# Patient Record
Sex: Male | Born: 1950 | Race: White | Hispanic: No | Marital: Married | Smoking: Former smoker
Health system: Southern US, Community
[De-identification: ages and names within clinical notes are randomized; demographics above are authoritative.]

## PROBLEM LIST (undated history)

## (undated) ENCOUNTER — Emergency Department (HOSPITAL_BASED_OUTPATIENT_CLINIC_OR_DEPARTMENT_OTHER): Admission: EM | Payer: PRIVATE HEALTH INSURANCE

## (undated) DIAGNOSIS — R079 Chest pain, unspecified: Secondary | ICD-10-CM

## (undated) DIAGNOSIS — G4730 Sleep apnea, unspecified: Secondary | ICD-10-CM

## (undated) DIAGNOSIS — R0789 Other chest pain: Secondary | ICD-10-CM

## (undated) DIAGNOSIS — I10 Essential (primary) hypertension: Secondary | ICD-10-CM

## (undated) DIAGNOSIS — E7800 Pure hypercholesterolemia, unspecified: Secondary | ICD-10-CM

## (undated) DIAGNOSIS — R7303 Prediabetes: Secondary | ICD-10-CM

## (undated) DIAGNOSIS — L570 Actinic keratosis: Secondary | ICD-10-CM

## (undated) HISTORY — DX: Prediabetes: R73.03

## (undated) HISTORY — PX: VASECTOMY: SHX75

## (undated) HISTORY — PX: COLONOSCOPY W/ BIOPSIES AND POLYPECTOMY: SHX1376

## (undated) HISTORY — PX: WISDOM TOOTH EXTRACTION: SHX21

## (undated) HISTORY — DX: Chest pain, unspecified: R07.9

## (undated) HISTORY — DX: Actinic keratosis: L57.0

## (undated) HISTORY — DX: Sleep apnea, unspecified: G47.30

## (undated) HISTORY — DX: Essential (primary) hypertension: I10

## (undated) HISTORY — DX: Other chest pain: R07.89

---

## 2001-12-31 ENCOUNTER — Ambulatory Visit (HOSPITAL_BASED_OUTPATIENT_CLINIC_OR_DEPARTMENT_OTHER): Admission: RE | Admit: 2001-12-31 | Discharge: 2001-12-31 | Payer: Self-pay | Admitting: Family Medicine

## 2004-03-25 ENCOUNTER — Ambulatory Visit (HOSPITAL_COMMUNITY): Admission: RE | Admit: 2004-03-25 | Discharge: 2004-03-25 | Payer: Self-pay | Admitting: Gastroenterology

## 2004-03-25 ENCOUNTER — Encounter (INDEPENDENT_AMBULATORY_CARE_PROVIDER_SITE_OTHER): Payer: Self-pay | Admitting: *Deleted

## 2004-10-12 ENCOUNTER — Encounter: Admission: RE | Admit: 2004-10-12 | Discharge: 2004-10-12 | Payer: Self-pay | Admitting: Family Medicine

## 2009-10-15 ENCOUNTER — Ambulatory Visit: Payer: Self-pay | Admitting: Pulmonary Disease

## 2009-10-15 DIAGNOSIS — E785 Hyperlipidemia, unspecified: Secondary | ICD-10-CM

## 2009-10-15 DIAGNOSIS — G4733 Obstructive sleep apnea (adult) (pediatric): Secondary | ICD-10-CM

## 2009-10-15 DIAGNOSIS — R05 Cough: Secondary | ICD-10-CM

## 2011-01-08 ENCOUNTER — Encounter: Payer: Self-pay | Admitting: Family Medicine

## 2011-05-06 NOTE — Op Note (Signed)
NAME:  LOUDON, KRAKOW NO.:  [id number]   MEDICAL RECORD NO.:  [id number]                   PATIENT TYPE:  AMB   LOCATION:  ENDO                                 FACILITY:  [facility]   PHYSICIAN:  Graylin Shiver, M.D.                DATE OF BIRTH:  Mar 31, 1951   DATE OF PROCEDURE:  03/25/2004  DATE OF DISCHARGE:                                 OPERATIVE REPORT   PROCEDURE:  Colonoscopy with polypectomy and biopsy.   INDICATIONS FOR PROCEDURE:  Family history of colon polyps and colon cancer.   CONSENT:  Informed consent was obtained after explanation of the risks of  bleeding, infection, and perforation.   PREMEDICATION:  Fentanyl 80 mcg IV, Versed 8 mg IV.   PROCEDURE IN DETAIL:  With the patient in the left lateral decubitus  position, a rectal exam was performed and no masses were felt.  The Olympus  colonoscope was inserted into the rectum and advanced around the colon to  the cecum.  Cecal landmarks were identified.  The cecum and ascending colon  were normal.  In the mid transverse colon, there was a small polyp which was  biopsied with cold forceps.  The descending colon and sigmoid revealed  diverticulosis.  In the descending colon, there was a small polyp biopsied  with cold forceps.  In the sigmoid, there were several small polyps which  were snared and removed by snare cautery technique.  In the rectum, there  were several small polyps, one of which was snared, the others were biopsied  off with cold forceps.  The polypoid tissue was retrieved.  He tolerated the  procedure well without complications.   IMPRESSION:  1. Numerous colon polyps.  2. Diverticulosis.   PLAN:  The pathology will be checked.  I would recommend follow up  colonoscopy again in three years.                                               Graylin Shiver, M.D.    SFG/MEDQ  D:  03/25/2004  T:  03/25/2004  Job:  [id number]   cc:   Caryn Bee L. Little, M.D.  [address]  [place]  [place] [address]  Fax: [phone]

## 2013-07-14 ENCOUNTER — Encounter (HOSPITAL_BASED_OUTPATIENT_CLINIC_OR_DEPARTMENT_OTHER): Payer: Self-pay | Admitting: *Deleted

## 2013-07-14 ENCOUNTER — Emergency Department (HOSPITAL_BASED_OUTPATIENT_CLINIC_OR_DEPARTMENT_OTHER)
Admission: EM | Admit: 2013-07-14 | Discharge: 2013-07-14 | Disposition: A | Discharge status: Home / Self Care | Payer: BC Managed Care – PPO | Attending: Emergency Medicine | Admitting: Emergency Medicine

## 2013-07-14 DIAGNOSIS — Y9389 Activity, other specified: Secondary | ICD-10-CM | POA: Insufficient documentation

## 2013-07-14 DIAGNOSIS — Z7982 Long term (current) use of aspirin: Secondary | ICD-10-CM | POA: Insufficient documentation

## 2013-07-14 DIAGNOSIS — Y92009 Unspecified place in unspecified non-institutional (private) residence as the place of occurrence of the external cause: Secondary | ICD-10-CM | POA: Insufficient documentation

## 2013-07-14 DIAGNOSIS — Z8639 Personal history of other endocrine, nutritional and metabolic disease: Secondary | ICD-10-CM | POA: Insufficient documentation

## 2013-07-14 DIAGNOSIS — S058X9A Other injuries of unspecified eye and orbit, initial encounter: Secondary | ICD-10-CM | POA: Insufficient documentation

## 2013-07-14 DIAGNOSIS — IMO0002 Reserved for concepts with insufficient information to code with codable children: Secondary | ICD-10-CM | POA: Insufficient documentation

## 2013-07-14 DIAGNOSIS — S0502XA Injury of conjunctiva and corneal abrasion without foreign body, left eye, initial encounter: Secondary | ICD-10-CM

## 2013-07-14 DIAGNOSIS — Z862 Personal history of diseases of the blood and blood-forming organs and certain disorders involving the immune mechanism: Secondary | ICD-10-CM | POA: Insufficient documentation

## 2013-07-14 HISTORY — DX: Pure hypercholesterolemia, unspecified: E78.00

## 2013-07-14 MED ORDER — MOXIFLOXACIN HCL 0.5 % OP SOLN: 1.0000 [drp] | Freq: Three times a day (TID) | OPHTHALMIC | Status: DC

## 2013-07-14 MED ORDER — TETRACAINE HCL 0.5 % OP SOLN
OPHTHALMIC | Status: AC
  Administered 2013-07-14: 21:00:00
  Filled 2013-07-14: qty 2

## 2013-07-14 MED ORDER — FLUORESCEIN SODIUM 1 MG OP STRP
ORAL_STRIP | OPHTHALMIC | Status: AC
  Administered 2013-07-14: 21:00:00
  Filled 2013-07-14: qty 1

## 2013-07-14 NOTE — ED Notes (Signed)
  Pt reports mowing grass and getting stuck in the eye by a tree branch.  (L) eye red, drainage noted.  Denies change in vision.

## 2013-07-14 NOTE — ED Provider Notes (Signed)
CSN: [id number]     Arrival date & time 07/14/13  1925 History  This chart was scribed for Rolan Bucco, MD by Bennett Scrape, ED Scribe. This patient was seen in room MH05/MH05 and the patient's care was started at 8:22 PM.   First MD Initiated Contact with Patient 07/14/13 2007     Chief Complaint  Patient presents with  . Eye Injury    Patient is a 62 y.o. male presenting with eye injury. The history is provided by the patient. No language interpreter was used.  Eye Injury This is a new problem. The current episode started 6 to 12 hours ago. The problem occurs constantly. The problem has not changed since onset.Exacerbated by: opening eye. Relieved by: keeping eye closed.    HPI Comments: Ronald Peters is a 62 y.o. male who presents to the Emergency Department complaining of left eye injury that occurred around 2:30 PM today. Pt was mowing the yard and had a "tiny" twig fly up and hit him directly in the eye. He admits that he was wearing lasses at the time but states that the twig flew under the glasses and broke upon impact with his eye. He reports pain with opening his eye and lists watery drainage as an associated symptom. He states that his is concerned over having the feeling of a foreign body in his eye. He denies changes in his vision. Last TD was in 2007.   Past Medical History  Diagnosis Date  . Hypercholesteremia    History reviewed. No pertinent past surgical history. History reviewed. No pertinent family history. History  Substance Use Topics  . Smoking status: Never Smoker   . Smokeless tobacco: Not on file  . Alcohol Use: Yes     Comment: socially    Review of Systems  Eyes: Positive for pain, discharge and redness. Negative for visual disturbance.  Skin: Negative for wound.    Allergies  Review of patient's allergies indicates no known allergies.  Home Medications   Current Outpatient Rx  Name  Route  Sig  Dispense  Refill  . aspirin 81 MG tablet  Oral   Take 81 mg by mouth daily.         Marland Kitchen moxifloxacin (VIGAMOX) 0.5 % ophthalmic solution   Left Eye   Place 1 drop into the left eye 3 (three) times daily.   3 mL   0    Triage Vitals: BP 177/88  Pulse 60  Temp(Src) 98.6 F (37 C) (Oral)  Resp 18  Ht 5\' 11"  (1.803 m)  Wt 280 lb (127.007 kg)  BMI 39.07 kg/m2  SpO2 98%  Physical Exam  Nursing note and vitals reviewed. Constitutional: He is oriented to person, place, and time. He appears well-developed and well-nourished. No distress.  HENT:  Head: Normocephalic and atraumatic.  Eyes: EOM are normal. Pupils are equal, round, and reactive to light.  Left eye is erythematous, erythema in conjunctiva with watery discharge, no foreign body, no hyphema, fluorescein uptake at the 2 o'clock position of the cornea   Neck: Normal range of motion. Neck supple.  Musculoskeletal: Normal range of motion.  Neurological: He is alert and oriented to person, place, and time.  Skin: Skin is warm and dry.  Psychiatric: He has a normal mood and affect. His behavior is normal.    ED Course   Procedures (including critical care time)  DIAGNOSTIC STUDIES: Oxygen Saturation is 98% on room air, normal by my interpretation.    COORDINATION OF  CARE: 8:24 PM-Discussed treatment plan which includes tetracaine drops for further eye exam with pt at bedside and pt agreed to plan.  8:25 PM-Administered 2 drops of tetracaine to the left eye and fluorescein to the left eye. 8:28 PM-Informed pt of corneal abrasion. Discussed discharge plan which includes antibiotic eye drops with pt and pt agreed to plan. Pt declined pain medications. Also advised pt to follow up with an optometrist and pt agreed. BP is 154/93. Advised pt to watch his BP at home and f/u with PCP as needed. Addressed symptoms to return for with pt.   1. Corneal abrasion, left, initial encounter     MDM  Patient started on antibiotics. He has a regular optometrist he sees a [facility]. He was advised to followup with them if his symptoms do not improve within the next 48 hours or return here as needed for any worsening symptoms. He denied the need for any further pain medication. His tetanus shot is up-to-date.  I personally performed the services described in this documentation, which was scribed in my presence.  The recorded information has been reviewed and considered.    Rolan Bucco, MD 07/14/13 [phone]

## 2013-09-25 ENCOUNTER — Encounter (INDEPENDENT_AMBULATORY_CARE_PROVIDER_SITE_OTHER): Payer: Self-pay

## 2013-09-25 ENCOUNTER — Encounter: Payer: Self-pay | Admitting: Pulmonary Disease

## 2013-09-25 ENCOUNTER — Ambulatory Visit (INDEPENDENT_AMBULATORY_CARE_PROVIDER_SITE_OTHER): Payer: BC Managed Care – PPO | Admitting: Pulmonary Disease

## 2013-09-25 VITALS — BP 138/92 | HR 62 | Temp 98.0°F | Ht 71.5 in | Wt 289.2 lb

## 2013-09-25 DIAGNOSIS — G4733 Obstructive sleep apnea (adult) (pediatric): Secondary | ICD-10-CM

## 2013-09-25 NOTE — Assessment & Plan Note (Signed)
The patient has a history of significant obstructive sleep apnea, and has been wearing CPAP compliantly.  Unfortunately he has not followed up in many many years, but has kept up with his mask cushion changes.  He is now due for a mass, filters, hoses, and also a new device.  I will send the order into his home care company, and I've also encouraged him to work aggressively on weight loss.  I also reminded him that I typically see my sleep apnea patient yearly.

## 2013-09-25 NOTE — Patient Instructions (Signed)
Will send in an order for a new cpap device. Keep up with mask changes and supplies. Work on weight loss followup with me in one year if doing well.

## 2013-09-25 NOTE — Progress Notes (Signed)
Subjective:    Patient ID: Ronald Peters, male    DOB: [birth year], 62 y.o.   MRN: [id number]  HPI The patient is a 62 year old male who comes in today for reevaluation of obstructive sleep apnea.  He was diagnosed many years ago on CPAP, and has been compliant with his device over the years.  He has been keeping up with his cushion changes on his mask, but now the actual mask structure has broken, and he needs a replacement.  The patient feels that he has slept well with his device, and denies any pressure or issues.  He tells me that his machine is 60 years old, and he is due for a replacement.  He denies any significant sleepiness during the day, and his Epworth score is only 9.  He feels that he awakens refreshed in the mornings.  He tells me that his weight is up about 10 pounds over the last 2 years.   Sleep Questionnaire What time do you typically go to bed?( Between what hours) 10-1129 10-1129 at 1134 on 09/25/13 by Maisie Fus, CMA How long does it take you to fall asleep?  at 1134 on 09/25/13 by Maisie Fus, CMA How many times during the night do you wake up? 0 0 at 1134 on 09/25/13 by Maisie Fus, CMA What time do you get out of bed to start your day? 0530 0530 at 1134 on 09/25/13 by Maisie Fus, CMA Do you drive or operate heavy machinery in your occupation? No No at 1134 on 09/25/13 by Maisie Fus, CMA How much has your weight changed (up or down) over the past two years? (In pounds) 10 lb (4.536 kg)10 lb (4.536 kg) increase at 1134 on 09/25/13 by Maisie Fus, CMA Have you ever had a sleep study before? Yes Yes at 1134 on 09/25/13 by Maisie Fus, CMA If yes, [facility] at 1134 on 09/25/13 by Maisie Fus, CMA If yes, date of study? 10+years ago 10+years ago at 1134 on 09/25/13 by Maisie Fus, CMA Do you currently use CPAP? Yes Yes at 1134 on 09/25/13 by Maisie Fus, CMA If so, what pressure? 12 12 at 1134 on  09/25/13 by Maisie Fus, CMA Do you wear oxygen at any time? No No at 1134 on 09/25/13 by Maisie Fus, CMA   Review of Systems  Constitutional: Positive for unexpected weight change ( increase). Negative for fever.  HENT: Negative for congestion, dental problem, ear pain, nosebleeds, postnasal drip, rhinorrhea, sinus pressure, sneezing, sore throat and trouble swallowing.   Eyes: Negative for redness and itching.  Respiratory: Positive for shortness of breath. Negative for cough, chest tightness and wheezing.   Cardiovascular: Negative for palpitations and leg swelling.  Gastrointestinal: Negative for nausea and vomiting.  Genitourinary: Negative for dysuria.  Musculoskeletal: Negative for joint swelling.  Skin: Negative for rash.  Neurological: Negative for headaches.  Hematological: Does not bruise/bleed easily.  Psychiatric/Behavioral: Negative for dysphoric mood. The patient is not nervous/anxious.        Objective:   Physical Exam Constitutional:  Overweight male, no acute distress  HENT:  Nares patent without discharge, mild septal deviation to the left with narrowing.  Oropharynx without exudate, palate and uvula are thickened and elongated.   Eyes:  Perrla, eomi, no scleral icterus  Neck:  No JVD, no TMG  Cardiovascular:  Normal rate, regular rhythm, no rubs or gallops.  No murmurs  Intact distal pulses  Pulmonary :  Normal breath sounds, no stridor or respiratory distress   No rales, rhonchi, or wheezing  Abdominal:  Soft, nondistended, bowel sounds present.  No tenderness noted.   Musculoskeletal:  mild lower extremity edema noted.  Lymph Nodes:  No cervical lymphadenopathy noted  Skin:  No cyanosis noted  Neurologic:  Alert, appropriate, moves all 4 extremities without obvious deficit.         Assessment & Plan:

## 2017-03-09 ENCOUNTER — Encounter (HOSPITAL_COMMUNITY): Payer: Self-pay | Admitting: Emergency Medicine

## 2017-03-09 ENCOUNTER — Emergency Department (HOSPITAL_COMMUNITY): Payer: No Typology Code available for payment source

## 2017-03-09 ENCOUNTER — Emergency Department (HOSPITAL_COMMUNITY)
Admission: EM | Admit: 2017-03-09 | Discharge: 2017-03-09 | Disposition: A | Discharge status: Home / Self Care | Payer: No Typology Code available for payment source | Attending: Emergency Medicine | Admitting: Emergency Medicine

## 2017-03-09 DIAGNOSIS — Z7982 Long term (current) use of aspirin: Secondary | ICD-10-CM | POA: Insufficient documentation

## 2017-03-09 DIAGNOSIS — R0789 Other chest pain: Secondary | ICD-10-CM | POA: Diagnosis present

## 2017-03-09 DIAGNOSIS — I10 Essential (primary) hypertension: Secondary | ICD-10-CM | POA: Insufficient documentation

## 2017-03-09 DIAGNOSIS — R079 Chest pain, unspecified: Secondary | ICD-10-CM

## 2017-03-09 DIAGNOSIS — Z87891 Personal history of nicotine dependence: Secondary | ICD-10-CM | POA: Insufficient documentation

## 2017-03-09 DIAGNOSIS — Z79899 Other long term (current) drug therapy: Secondary | ICD-10-CM | POA: Insufficient documentation

## 2017-03-09 HISTORY — DX: Essential (primary) hypertension: I10

## 2017-03-09 LAB — BASIC METABOLIC PANEL
Anion gap: 9 (ref 5–15)
BUN: 10 mg/dL (ref 6–20)
CALCIUM: 9.7 mg/dL (ref 8.9–10.3)
CHLORIDE: 102 mmol/L (ref 101–111)
CO2: 28 mmol/L (ref 22–32)
CREATININE: 1 mg/dL (ref 0.61–1.24)
GFR calc Af Amer: >60 mL/min (ref >60)
GFR calc non Af Amer: >60 mL/min (ref >60)
GLUCOSE: 166 mg/dL — AB (ref 65–99)
Potassium: 3.7 mmol/L (ref 3.5–5.1)
Sodium: 139 mmol/L (ref 135–145)

## 2017-03-09 LAB — I-STAT TROPONIN, ED
TROPONIN I, POC: 0 ng/mL (ref 0.00–0.08)
Troponin i, poc: 0 ng/mL (ref 0.00–0.08)

## 2017-03-09 LAB — CBC
HCT: 47.2 % (ref 39.0–52.0)
Hemoglobin: 15.5 g/dL (ref 13.0–17.0)
MCH: 31.2 pg (ref 26.0–34.0)
MCHC: 32.8 g/dL (ref 30.0–36.0)
MCV: 95 fL (ref 78.0–100.0)
PLATELETS: 168 10*3/uL (ref 150–400)
RBC: 4.97 MIL/uL (ref 4.22–5.81)
RDW: 13.7 % (ref 11.5–15.5)
WBC: 7.2 10*3/uL (ref 4.0–10.5)

## 2017-03-09 NOTE — ED Triage Notes (Signed)
Patient reports mid/epigastric chest pain " pressure" onset this morning , denies SOB , no nausea or diaphoresis .

## 2017-03-09 NOTE — ED Provider Notes (Signed)
[facility] DEPT Provider Note   CSN: [id number] Arrival date & time: 03/09/17  0026     History   Chief Complaint Chief Complaint  Patient presents with  . Chest Pain    HPI Ronald Peters is a 66 y.o. male.  Patient with HLD, OSA presents for evaluation of chest tightness that started yesterday morning while at home. The tightness was constant and progressive throughout the day. No associated SOB, nausea, cough. No recent illness or fever. No modifying factors. No belching, sensation of gas or abdominal distention. Since arrival to the hospital his symptoms have completely resolved.   The history is provided by the patient. No language interpreter was used.  Chest Pain   Pertinent negatives include no abdominal pain, no cough, no fever, no nausea, no shortness of breath and no vomiting.    Past Medical History:  Diagnosis Date  . Hypercholesteremia   . Hypertension   . Pre-diabetes   . Sleep apnea     Patient Active Problem List   Diagnosis Date Noted  . HYPERLIPIDEMIA 10/15/2009  . OBSTRUCTIVE SLEEP APNEA 10/15/2009  . COUGH 10/15/2009    Past Surgical History:  Procedure Laterality Date  . COLONOSCOPY W/ BIOPSIES AND POLYPECTOMY    . WISDOM TOOTH EXTRACTION         Home Medications    Prior to Admission medications   Medication Sig Start Date End Date Taking? Authorizing Provider  aspirin 81 MG tablet Take 81 mg by mouth daily.   Yes Historical Provider, MD  loratadine (CLARITIN) 10 MG tablet Take 10 mg by mouth daily.   Yes Historical Provider, MD  Omega-3 Fatty Acids (FISH OIL PO) Take 1 tablet by mouth daily.   Yes Historical Provider, MD  polycarbophil (FIBERCON) 625 MG tablet Take 625 mg by mouth 2 (two) times daily.   Yes Historical Provider, MD  pravastatin (PRAVACHOL) 40 MG tablet Take 40 mg by mouth daily.   Yes Historical Provider, MD    Family History Family History  Problem Relation Age of Onset  . Heart disease Father   . Stroke Father      multiple  . Hypertension Mother   . Colon cancer Maternal Grandmother   . Cancer Paternal Aunt     Social History Social History  Substance Use Topics  . Smoking status: Former Smoker    Packs/day: 2.00    Years: 25.00    Types: Cigarettes    Quit date: 12/19/1993  . Smokeless tobacco: Never Used  . Alcohol use Yes     Comment: socially     Allergies   Patient has no known allergies.   Review of Systems Review of Systems  Constitutional: Negative for chills and fever.  HENT: Negative.   Respiratory: Negative.  Negative for cough and shortness of breath.   Cardiovascular: Positive for chest pain. Negative for leg swelling.  Gastrointestinal: Negative.  Negative for abdominal distention, abdominal pain, nausea and vomiting.  Musculoskeletal: Negative.   Neurological: Negative.      Physical Exam Updated Vital Signs BP 138/83   Pulse 71   Temp 98 F (36.7 C) (Oral)   Resp 17   SpO2 96%   Physical Exam  Constitutional: He is oriented to person, place, and time. He appears well-developed and well-nourished.  HENT:  Head: Normocephalic.  Neck: Normal range of motion. Neck supple.  Cardiovascular: Normal rate and regular rhythm.   No murmur heard. Pulmonary/Chest: Effort normal and breath sounds normal. He has no wheezes.  He has no rales. He exhibits no tenderness.  Abdominal: Soft. Bowel sounds are normal. There is no tenderness. There is no rebound and no guarding.  Musculoskeletal: Normal range of motion. He exhibits no edema.  Neurological: He is alert and oriented to person, place, and time.  Skin: Skin is warm and dry.  Psychiatric: He has a normal mood and affect.     ED Treatments / Results  Labs (all labs ordered are listed, but only abnormal results are displayed) Labs Reviewed  BASIC METABOLIC PANEL - Abnormal; Notable for the following:       Result Value   Glucose, Bld 166 (*)    All other components within normal limits  CBC  I-STAT  TROPOININ, ED   Results for orders placed or performed during the hospital encounter of 75/91/63  Basic metabolic panel  Result Value Ref Range   Sodium 139 135 - 145 mmol/L   Potassium 3.7 3.5 - 5.1 mmol/L   Chloride 102 101 - 111 mmol/L   CO2 28 22 - 32 mmol/L   Glucose, Bld 166 (H) 65 - 99 mg/dL   BUN 10 6 - 20 mg/dL   Creatinine, Ser 1.00 0.61 - 1.24 mg/dL   Calcium 9.7 8.9 - 10.3 mg/dL   GFR calc non Af Amer >60 >60 mL/min   GFR calc Af Amer >60 >60 mL/min   Anion gap 9 5 - 15  CBC  Result Value Ref Range   WBC 7.2 4.0 - 10.5 K/uL   RBC 4.97 4.22 - 5.81 MIL/uL   Hemoglobin 15.5 13.0 - 17.0 g/dL   HCT 47.2 39.0 - 52.0 %   MCV 95.0 78.0 - 100.0 fL   MCH 31.2 26.0 - 34.0 pg   MCHC 32.8 30.0 - 36.0 g/dL   RDW 13.7 11.5 - 15.5 %   Platelets 168 150 - 400 K/uL  I-stat troponin, ED  Result Value Ref Range   Troponin i, poc 0.00 0.00 - 0.08 ng/mL   Comment 3            EKG  EKG Interpretation  Date/Time:  Thursday March 09 2017 00:34:07 EDT Ventricular Rate:  61 PR Interval:  174 QRS Duration: 98 QT Interval:  400 QTC Calculation: 402 R Axis:   13 Text Interpretation:  Normal sinus rhythm Normal ECG No prior for comparison Confirmed by Dina Rich  MD, COURTNEY ([id number]) on 03/09/2017 12:38:53 AM       Radiology Dg Chest 2 View  Result Date: 03/09/2017 CLINICAL DATA:  Initial evaluation for acute chest pressure. EXAM: CHEST  2 VIEW COMPARISON:  Prior radiograph from 11/26/2015. FINDINGS: The cardiac and mediastinal silhouettes are stable in size and contour, and remain within normal limits. The lungs are normally inflated. No airspace consolidation, pleural effusion, or pulmonary edema is identified. There is no pneumothorax. No acute osseous abnormality identified. IMPRESSION: No active cardiopulmonary disease. Electronically Signed   By: Jeannine Boga M.D.   On: 03/09/2017 01:12    Procedures Procedures (including critical care time)  Medications Ordered in  ED Medications - No data to display   Initial Impression / Assessment and Plan / ED Course  I have reviewed the triage vital signs and the nursing notes.  Pertinent labs & imaging results that were available during my care of the patient were reviewed by me and considered in my medical decision making (see chart for details).     Patient with chest tightness all day yesterday, now resolved. No history  of cardiac disease. His lab studies are reassuring with negative troponin, EKG without acute changes, CXR clear. Discussed with Dr. Venora Maples. Patient can be discharged home and needs to follow up with PCP for outpatient recheck.   Final Clinical Impressions(s) / ED Diagnoses   Final diagnoses:  None   1. Nonspecific chest pain  New Prescriptions New Prescriptions   No medications on file     Charlann Lange, Hershal Coria 03/09/17 [place], MD 03/09/17 [phone]

## 2017-03-15 ENCOUNTER — Telehealth: Payer: Self-pay | Admitting: *Deleted

## 2017-03-15 NOTE — Telephone Encounter (Signed)
NOTES SENT TO SCHEDULING.  °

## 2017-03-28 ENCOUNTER — Telehealth: Payer: Self-pay | Admitting: Cardiovascular Disease

## 2017-03-28 NOTE — Telephone Encounter (Signed)
Received records from [place] for appointment on 04/12/17 with Dr Oval Linsey.  Records put with Dr Blenda Mounts schedule for 04/12/17. lp

## 2017-04-12 ENCOUNTER — Encounter: Payer: Self-pay | Admitting: Cardiovascular Disease

## 2017-04-12 ENCOUNTER — Ambulatory Visit (INDEPENDENT_AMBULATORY_CARE_PROVIDER_SITE_OTHER): Payer: PRIVATE HEALTH INSURANCE | Admitting: Cardiovascular Disease

## 2017-04-12 VITALS — BP 151/85 | HR 88 | Ht 70.0 in | Wt 286.0 lb

## 2017-04-12 DIAGNOSIS — I10 Essential (primary) hypertension: Secondary | ICD-10-CM | POA: Diagnosis not present

## 2017-04-12 DIAGNOSIS — R079 Chest pain, unspecified: Secondary | ICD-10-CM | POA: Diagnosis not present

## 2017-04-12 DIAGNOSIS — E7800 Pure hypercholesterolemia, unspecified: Secondary | ICD-10-CM | POA: Diagnosis not present

## 2017-04-12 DIAGNOSIS — R0789 Other chest pain: Secondary | ICD-10-CM | POA: Diagnosis not present

## 2017-04-12 NOTE — Patient Instructions (Signed)
Medication Instructions:  Your physician recommends that you continue on your current medications as directed. Please refer to the Current Medication list given to you today.  Labwork: Will get fasting labs at your follow up appointment in 3 months   Testing/Procedures: Your physician has requested that you have an exercise tolerance test. For further information please visit [email or web address]. Please also follow instruction sheet, as given.  Follow-Up: Your physician recommends that you schedule a follow-up appointment in: 3 month ov  Any Other Special Instructions Will Be Listed Below (If Applicable). MONITOR YOUR BLOOD PRESSURE ABOUT 3-4 TIMES A WEEK, LOG AND BRING WITH YOU TO YOUR FOLLOW UP APPOINTMENT  EXERCISE 90-150 MINUTES EACH WEEK   If you need a refill on your cardiac medications before your next appointment, please call your pharmacy.

## 2017-04-12 NOTE — Progress Notes (Signed)
Cardiology Office Note   Date:  04/17/2017   ID:  Ronald Peters, DOB 1951-07-16, MRN [id number]  PCP:  Gennette Pac, MD  Cardiologist:   Skeet Latch, MD   Chief Complaint  Patient presents with  . New Patient (Initial Visit)  . Edema    Slightly in legs occasionally.     History of Present Illness: Ronald Peters is a 66 y.o. male with hypertension, hyperlipidemia, and OSA on CPAP who is being seen today for the evaluation of chest pain at the request of Little, Lennette Bihari, MD.  Ronald Peters was seen in the ED 03/09/17 with chest tightness and have been ongoing since the previous day. There was no associated shortness of breath, nausea, or diaphoresis.  Symptoms were thought to be consistent with esophageal spasm. He was started on a PPI and referred to cardiology for further evaluation. He felt tightness in his chest that felt like when he's had cold in the past. The symptoms lasted 12-14 hours and did not change with position. There is no associated shortness of breath, nausea, or diaphoresis. He just felt generally uncomfortable.  The following day he felt like someone was pressing in his epigastrum.  In the ED cardiac enzymes were negative x2 and his EKG was negative for ischemia. It is recommended that he try Nexium but has not started this.  Ronald Peters does not get any regular exercise.  He denies exertional chest pain or shortness of breath. He has some mild lower extremity edema that gets better with elevation. He reports that he sits for most of the day of work and does not elevate his legs. He typically walks approximately 4000 steps daily. His blood pressure at home has been around 130s over 80s.    Past Medical History:  Diagnosis Date  . Atypical chest pain 04/17/2017  . Chest pain   . Essential hypertension 04/17/2017  . Hypercholesteremia   . Hypertension   . Pre-diabetes   . Sleep apnea     Past Surgical History:  Procedure Laterality Date  . COLONOSCOPY W/ BIOPSIES  AND POLYPECTOMY    . VASECTOMY    . WISDOM TOOTH EXTRACTION       Current Outpatient Prescriptions  Medication Sig Dispense Refill  . aspirin 81 MG tablet Take 81 mg by mouth daily.    Marland Kitchen loratadine (CLARITIN) 10 MG tablet Take 10 mg by mouth daily.    . Multiple Vitamins-Minerals (MULTIVITAMIN ADULT PO) Take by mouth.    . Omega-3 Fatty Acids (FISH OIL PO) Take 1 tablet by mouth daily.    . pravastatin (PRAVACHOL) 10 MG tablet Take 10 mg by mouth daily.    . PSYLLIUM HUSK PO Take 12 g by mouth daily.     No current facility-administered medications for this visit.     Allergies:   Simvastatin    Social History:  The patient  reports that he quit smoking about 23 years ago. His smoking use included Cigarettes. He has a 50.00 pack-year smoking history. He has never used smokeless tobacco. He reports that he drinks alcohol. He reports that he does not use drugs.   Family History:  The patient's family history includes Atrial fibrillation in his mother; Cancer in his paternal aunt; Colon cancer in his maternal grandmother; Heart disease in his father; Heart failure in his father; Hypertension in his mother; Stroke in his father and mother.    ROS:  Please see the history of present illness.   Otherwise, review  of systems are positive for none.   All other systems are reviewed and negative.     PHYSICAL EXAM: VS:  BP (!) 151/85   Pulse 88   Ht 5\' 10"  (1.778 m)   Wt 129.7 kg (286 lb)   BMI 41.04 kg/m  , BMI Body mass index is 41.04 kg/m. GENERAL:  Well appearing HEENT:  Pupils equal round and reactive, fundi not visualized, oral mucosa unremarkable NECK:  No jugular venous distention, waveform within normal limits, carotid upstroke brisk and symmetric, no bruits, no thyromegaly LYMPHATICS:  No cervical adenopathy LUNGS:  Clear to auscultation bilaterally HEART:  RRR.  PMI not displaced or sustained,S1 and S2 within normal limits, no S3, no S4, no clicks, no rubs, no murmurs ABD:   Flat, positive bowel sounds normal in frequency in pitch, no bruits, no rebound, no guarding, no midline pulsatile mass, no hepatomegaly, no splenomegaly EXT:  2 plus pulses throughout, no edema, no cyanosis no clubbing SKIN:  No rashes no nodules NEURO:  Cranial nerves II through XII grossly intact, motor grossly intact throughout PSYCH:  Cognitively intact, oriented to person place and time    EKG:  EKG is not ordered today. The ekg ordered 03/09/17 demonstrates sinus rhythm. Rate 61 bpm.   Recent Labs: 03/09/2017: BUN 10; Creatinine, Ser 1.00; Hemoglobin 15.5; Platelets 168; Potassium 3.7; Sodium 139   12/22/16: A 5.6, hemoglobin 15.3, hematocrit 44.9, platelets 158 sodium 140, potassium 4.3, BUN 15, creatinine 0 point AST 28, ALT 45 Cholesterol 188, trig17, HDL 37, LDL 117 TSH 1.69 Hemoglobin A1c 5.4%   Lipid Panel No results found for: CHOL, TRIG, HDL, CHOLHDL, VLDL, LDLCALC, LDLDIRECT    Wt Readings from Last 3 Encounters:  04/12/17 129.7 kg (286 lb)  09/25/13 131.2 kg (289 lb 3.2 oz)  07/14/13 127 kg (280 lb)      ASSESSMENT AND PLAN:  # Chest pain: Symptoms are atypical. However he doesn't exert himself  Much to know if he has exertional symptoms.  We will get an ETT to evaluate for ischemia.  Agree with trial of PPI/H2 blocker.  # Hypertension:  Blood pressure was initially elevated and improved on repeat. He reports that it has been in the 130s over 80s when he gets blood every 6 weeks. We discussed the importance of increasing his exercise to at least 90-150 minutes weekly. We also discussed the importance of sodium restriction. If his blood pressure remains elevated at follow-up we will start an antihypertensive at that time.  # Hyperlipidemia:  Continue pravastatin and fish oil.  Current medicines are reviewed at length with the patient today.  The patient does not have concerns regarding medicines.  The following changes have been made:  no change  Labs/ tests  ordered today include:   Orders Placed This Encounter  Procedures  . Exercise Tolerance Test     Disposition:   FU with Dayveon Halley C. Oval Linsey, MD, [facility] in 3 months    This note was written with the assistance of speech recognition software.  Please excuse any transcriptional errors.  Signed, Gage Weant C. Oval Linsey, MD, [facility]  04/17/2017 1:31 PM    [facility]

## 2017-04-17 ENCOUNTER — Encounter: Payer: Self-pay | Admitting: Cardiovascular Disease

## 2017-04-17 DIAGNOSIS — I10 Essential (primary) hypertension: Secondary | ICD-10-CM

## 2017-04-17 DIAGNOSIS — R0789 Other chest pain: Secondary | ICD-10-CM

## 2017-04-17 HISTORY — DX: Other chest pain: R07.89

## 2017-04-17 HISTORY — DX: Essential (primary) hypertension: I10

## 2017-05-05 ENCOUNTER — Telehealth (HOSPITAL_COMMUNITY): Payer: Self-pay

## 2017-05-05 NOTE — Telephone Encounter (Signed)
Encounter complete. 

## 2017-05-10 ENCOUNTER — Ambulatory Visit (HOSPITAL_COMMUNITY)
Admission: RE | Admit: 2017-05-10 | Discharge: 2017-05-10 | Disposition: A | Discharge status: Home / Self Care | Payer: PRIVATE HEALTH INSURANCE | Source: Ambulatory Visit | Attending: Cardiology | Admitting: Cardiology

## 2017-05-10 DIAGNOSIS — R079 Chest pain, unspecified: Secondary | ICD-10-CM | POA: Diagnosis not present

## 2017-05-10 LAB — EXERCISE TOLERANCE TEST
CHL RATE OF PERCEIVED EXERTION: 16
CSEPED: 5 min
CSEPEW: 7 METS
CSEPHR: 100 %
MPHR: 156 {beats}/min
Peak HR: 157 {beats}/min
Rest HR: 64 {beats}/min

## 2017-05-15 ENCOUNTER — Encounter (HOSPITAL_BASED_OUTPATIENT_CLINIC_OR_DEPARTMENT_OTHER): Payer: Self-pay

## 2017-05-15 ENCOUNTER — Emergency Department (HOSPITAL_BASED_OUTPATIENT_CLINIC_OR_DEPARTMENT_OTHER)
Admission: EM | Admit: 2017-05-15 | Discharge: 2017-05-15 | Disposition: A | Discharge status: Home / Self Care | Payer: PRIVATE HEALTH INSURANCE | Attending: Emergency Medicine | Admitting: Emergency Medicine

## 2017-05-15 ENCOUNTER — Emergency Department (HOSPITAL_BASED_OUTPATIENT_CLINIC_OR_DEPARTMENT_OTHER): Payer: PRIVATE HEALTH INSURANCE

## 2017-05-15 DIAGNOSIS — L03116 Cellulitis of left lower limb: Secondary | ICD-10-CM | POA: Diagnosis not present

## 2017-05-15 DIAGNOSIS — Z7982 Long term (current) use of aspirin: Secondary | ICD-10-CM | POA: Insufficient documentation

## 2017-05-15 DIAGNOSIS — R600 Localized edema: Secondary | ICD-10-CM

## 2017-05-15 DIAGNOSIS — I10 Essential (primary) hypertension: Secondary | ICD-10-CM | POA: Insufficient documentation

## 2017-05-15 DIAGNOSIS — Z87891 Personal history of nicotine dependence: Secondary | ICD-10-CM | POA: Insufficient documentation

## 2017-05-15 DIAGNOSIS — M7989 Other specified soft tissue disorders: Secondary | ICD-10-CM | POA: Diagnosis present

## 2017-05-15 NOTE — ED Provider Notes (Signed)
[facility]PT [facility] Provider Note   CSN: [id number] Arrival date & time: 05/15/17  1107     History   Chief Complaint Chief Complaint  Patient presents with  . Leg Swelling    HPI Ronald Peters is a 65 y.o. male.  HPI Patient presents with leg swelling. Worse in the left leg compared to the right. Seen at [facility] clinic was sent in for a Doppler. Had recent plane flight back from [place]. No fevers. Has a history of edema in his legs. Left tends to be worse in the right. Now it is more erythematous. This is increased or last couple days. No fevers. No chest pain. No trouble breathing.   Past Medical History:  Diagnosis Date  . Atypical chest pain 04/17/2017  . Chest pain   . Essential hypertension 04/17/2017  . Hypercholesteremia   . Hypertension   . Pre-diabetes   . Sleep apnea     Patient Active Problem List   Diagnosis Date Noted  . Essential hypertension 04/17/2017  . Atypical chest pain 04/17/2017  . HYPERLIPIDEMIA 10/15/2009  . OBSTRUCTIVE SLEEP APNEA 10/15/2009  . COUGH 10/15/2009    Past Surgical History:  Procedure Laterality Date  . COLONOSCOPY W/ BIOPSIES AND POLYPECTOMY    . VASECTOMY    . WISDOM TOOTH EXTRACTION         Home Medications    Prior to Admission medications   Medication Sig Start Date End Date Taking? Authorizing Provider  aspirin 81 MG tablet Take 81 mg by mouth daily.    [provider]  loratadine (CLARITIN) 10 MG tablet Take 10 mg by mouth daily.    [provider]  Multiple Vitamins-Minerals (MULTIVITAMIN ADULT PO) Take by mouth.    [provider]  Omega-3 Fatty Acids (FISH OIL PO) Take 1 tablet by mouth daily.    [provider]  pravastatin (PRAVACHOL) 10 MG tablet Take 10 mg by mouth daily.    [provider]  PSYLLIUM HUSK PO Take 12 g by mouth daily.    [provider]    Family History Family History  Problem Relation Age of Onset  . Heart disease Father   .  Stroke Father        multiple  . Heart failure Father   . Hypertension Mother   . Atrial fibrillation Mother   . Stroke Mother   . Colon cancer Maternal Grandmother   . Cancer Paternal Aunt     Social History Social History  Substance Use Topics  . Smoking status: Former Smoker    Packs/day: 2.00    Years: 25.00    Types: Cigarettes    Quit date: 12/19/1993  . Smokeless tobacco: Never Used  . Alcohol use Yes     Comment: socially     Allergies   Simvastatin   Review of Systems Review of Systems  Constitutional: Negative for appetite change, chills and fever.  Respiratory: Negative for cough and shortness of breath.   Cardiovascular: Positive for leg swelling.  Gastrointestinal: Negative for abdominal pain.  Genitourinary: Negative for flank pain.  Musculoskeletal: Negative for back pain.  Skin: Positive for wound.  Neurological: Negative for seizures.  Psychiatric/Behavioral: Negative for confusion.     Physical Exam Updated Vital Signs BP (!) 143/85 (BP Location: Left Arm)   Pulse 63   Temp 98.7 F (37.1 C) (Oral)   Resp 17   Ht 5\' 10"  (1.778 m)   Wt 128.4 kg (283 lb 2 oz)  SpO2 98%   BMI 40.62 kg/m   Physical Exam  Constitutional: He appears well-developed.  Eyes: EOM are normal.  Neck: Neck supple.  Cardiovascular: Normal rate.   Pulmonary/Chest: Effort normal.  Abdominal: There is no tenderness.  Musculoskeletal: He exhibits edema.  Some edema of right lower extremity without erythema. Left lower extremity has rate or edema with some erythema on the lower leg. Neurovascular intact in foot.  Neurological: He is alert.  Skin: Skin is warm. Capillary refill takes less than 2 seconds.  Psychiatric: He has a normal mood and affect.     ED Treatments / Results  Labs (all labs ordered are listed, but only abnormal results are displayed) Labs Reviewed - No data to display  EKG  EKG Interpretation None       Radiology US Venous Img Lower  Unilateral Left  Result Date: 05/15/2017 CLINICAL DATA:  Left lower leg redness, swelling, and pain x2 days EXAM: LEFT LOWER EXTREMITY VENOUS DOPPLER ULTRASOUND TECHNIQUE: Gray-scale sonography with graded compression, as well as color Doppler and duplex ultrasound were performed to evaluate the lower extremity deep venous systems from the level of the common femoral vein and including the common femoral, femoral, profunda femoral, popliteal and calf veins including the posterior tibial, peroneal and gastrocnemius veins when visible. The superficial great saphenous vein was also interrogated. Spectral Doppler was utilized to evaluate flow at rest and with distal augmentation maneuvers in the common femoral, femoral and popliteal veins. COMPARISON:  None. FINDINGS: Contralateral Common Femoral Vein: Respiratory phasicity is normal and symmetric with the symptomatic side. No evidence of thrombus. Normal compressibility. Common Femoral Vein: No evidence of thrombus. Normal compressibility, respiratory phasicity and response to augmentation. Saphenofemoral Junction: No evidence of thrombus. Normal compressibility and flow on color Doppler imaging. Profunda Femoral Vein: No evidence of thrombus. Normal compressibility and flow on color Doppler imaging. Femoral Vein: No evidence of thrombus. Normal compressibility, respiratory phasicity and response to augmentation. Popliteal Vein: No evidence of thrombus. Normal compressibility, respiratory phasicity and response to augmentation. Calf Veins: No evidence of thrombus. Normal compressibility and flow on color Doppler imaging. Superficial Great Saphenous Vein: No evidence of thrombus. Normal compressibility and flow on color Doppler imaging. Venous Reflux:  None. Other Findings:  Mild subcutaneous edema at the ankle. IMPRESSION: No evidence of DVT within the left lower extremity. Electronically Signed   By: Julian Hy M.D.   On: 05/15/2017 12:18     Procedures Procedures (including critical care time)  Medications Ordered in ED Medications - No data to display   Initial Impression / Assessment and Plan / ED Course  I have reviewed the triage vital signs and the nursing notes.  Pertinent labs & imaging results that were available during my care of the patient were reviewed by me and considered in my medical decision making (see chart for details).     Patient with leg swelling. Sent in to rule out DVT. Negative Doppler. The provider that sent him in artery ordered compression stockings antibiotics. There is some erythema so this potentially could be a cellulitis. Discharge home.  Final Clinical Impressions(s) / ED Diagnoses   Final diagnoses:  Leg edema  Cellulitis of left lower extremity    New Prescriptions New Prescriptions   No medications on file     Davonna Belling, MD 05/15/17 1245

## 2017-05-15 NOTE — ED Notes (Signed)
Family at bedside. 

## 2017-05-15 NOTE — ED Notes (Signed)
ED Provider at bedside. 

## 2017-05-15 NOTE — ED Triage Notes (Signed)
C/o bilat leg swelling, redness x 2 days-left > than right-concerned for DVT due to recent air travel-sent from PCP office to r/o DVT-NAD-steady gait

## 2017-07-04 NOTE — Progress Notes (Signed)
Cardiology Office Note   Date:  07/05/2017   ID:  Ronald Peters, DOB 1951/10/24, MRN [id number]  PCP:  Hulan Fess, MD  Cardiologist:   Skeet Latch, MD   Chief Complaint  Patient presents with  . Follow-up    no chest pain, has shortness of breath with exertion, has edema in legs, no pain or cramping in legs, no dizziness     History of Present Illness: Ronald Peters is a 66 y.o. male with hypertension, hyperlipidemia, and OSA on CPAP here for follow up.  [name] was first seen 03/2017 for chest tightness.  Symptoms were thought to be consistent with esophageal spasm. He was started on a PPI and referred to cardiology for further evaluation.  [name] was referred for an ETT 04/2017 that was negative for ischemia.  He achieved 7 METS on a Bruce protocol. Since that time he was seen in the emergency department 04/2017 with leg swelling.  This occurred after a flight to [place].  He had lower extremity Dopplers that were negative for DVT.  He developed cellulitis requiring antibiotics.  Since that time has been feeling well. Denies chest pain but does have shortness of breath with exertion. This has been stable. He had notes mild lower extremity edema at the end of the day if he has been standing for a long period of time. It improves with elevation of his legs denies orthopnea or PND. He continues to use his CPAP regularly.  [name] Reports that he has not been exercising much. He walks approximately 3 days per week for about 10 minutes. He has no exertional symptoms but typically stops because he has other things he needs to do. He's been trying to make some dietary modifications but isn't following any specific diet. He thinks that his diet is overall healthy but he struggles more so with portion control.  He has been monitoring his blood pressure at home and has been mostly in the 621H to 086V systolic.   Past Medical History:  Diagnosis Date  . Atypical chest pain 04/17/2017  . Chest  pain   . Essential hypertension 04/17/2017  . Hypercholesteremia   . Hypertension   . Pre-diabetes   . Sleep apnea     Past Surgical History:  Procedure Laterality Date  . COLONOSCOPY W/ BIOPSIES AND POLYPECTOMY    . VASECTOMY    . WISDOM TOOTH EXTRACTION       Current Outpatient Prescriptions  Medication Sig Dispense Refill  . aspirin EC 81 MG tablet Take 81 mg by mouth daily.    Marland Kitchen loratadine (CLARITIN) 10 MG tablet Take 10 mg by mouth daily.    . Multiple Vitamins-Minerals (MULTIVITAMIN ADULT PO) Take by mouth.    . Omega-3 Fatty Acids (FISH OIL PO) Take 1 tablet by mouth daily.    . pravastatin (PRAVACHOL) 10 MG tablet Take 10 mg by mouth daily.    . PSYLLIUM HUSK PO Take 12 g by mouth daily.    . hydrochlorothiazide (MICROZIDE) 12.5 MG capsule Take 1 capsule (12.5 mg total) by mouth daily. 90 capsule 1   No current facility-administered medications for this visit.     Allergies:   Simvastatin    Social History:  The patient  reports that he quit smoking about 23 years ago. His smoking use included Cigarettes. He has a 50.00 pack-year smoking history. He has never used smokeless tobacco. He reports that he drinks alcohol. He reports that he does not use drugs.  Family History:  The patient's family history includes Atrial fibrillation in his mother; Cancer in his paternal aunt; Colon cancer in his maternal grandmother; Heart disease in his father; Heart failure in his father; Hypertension in his mother; Stroke in his father and mother.    ROS:  Please see the history of present illness.   Otherwise, review of systems are positive for none.   All other systems are reviewed and negative.     PHYSICAL EXAM: VS:  BP (!) 146/86   Pulse 64   Ht 5' 10.5" (1.791 m)   Wt 127.9 kg (282 lb)   BMI 39.89 kg/m  , BMI Body mass index is 39.89 kg/m. GENERAL:  Well appearing HEENT:  Pupils equal round and reactive, fundi not visualized, oral mucosa unremarkable NECK:  No jugular  venous distention, waveform within normal limits, carotid upstroke brisk and symmetric, no bruits, no thyromegaly LUNGS:  Clear to auscultation bilaterally HEART:  RRR.  PMI not displaced or sustained,S1 and S2 within normal limits, no S3, no S4, no clicks, no rubs, no murmurs ABD:  Flat, positive bowel sounds normal in frequency in pitch, no bruits, no rebound, no guarding, no midline pulsatile mass, no hepatomegaly, no splenomegaly EXT:  2 plus pulses throughout, no edema, no cyanosis no clubbing SKIN:  No rashes no nodules NEURO:  Cranial nerves II through XII grossly intact, motor grossly intact throughout PSYCH:  Cognitively intact, oriented to person place and time   EKG:  EKG is not ordered today. The ekg ordered 03/09/17 demonstrates sinus rhythm. Rate 61 bpm.  ETT 05/10/17:  Blood pressure demonstrated a hypertensive response to exercise.  There was no ST segment deviation noted during stress.  Negative, adequate stress test.   Recent Labs: 03/09/2017: BUN 10; Creatinine, Ser 1.00; Hemoglobin 15.5; Platelets 168; Potassium 3.7; Sodium 139   12/22/16: A 5.6, hemoglobin 15.3, hematocrit 44.9, platelets 158 sodium 140, potassium 4.3, BUN 15, creatinine 0 point AST 28, ALT 45 Cholesterol 188, trig17, HDL 37, LDL 117 TSH 1.69 Hemoglobin A1c 5.4%   Lipid Panel No results found for: CHOL, TRIG, HDL, CHOLHDL, VLDL, LDLCALC, LDLDIRECT    Wt Readings from Last 3 Encounters:  07/05/17 127.9 kg (282 lb)  05/15/17 128.4 kg (283 lb 2 oz)  04/12/17 129.7 kg (286 lb)      ASSESSMENT AND PLAN:  # Chest pain: Symptoms Have resolved. ETT was negative for ischemia.  # Hypertension:  Blood pressure remains elevated.  We will start hydrochlorothiazide12.5 mg daily. He will continue to monitor his blood pressure and return in one week for a basic metabolic panel. He'll see our pharmacist in 6 weeks for a blood pressure check.  # Hyperlipidemia:  Continue pravastatin and fish oil.  LDL  117 12/2016.  Goal <130.   Current medicines are reviewed at length with the patient today.  The patient does not have concerns regarding medicines.  The following changes have been made:  no change  Labs/ tests ordered today include:   Orders Placed This Encounter  Procedures  . Basic metabolic panel     Disposition:   FU with Juanda Luba C. Oval Linsey, MD, [facility] as needed.  PharmD in 6 weeks.    This note was written with the assistance of speech recognition software.  Please excuse any transcriptional errors.  Signed, Christoher Drudge C. Oval Linsey, MD, [facility]  07/05/2017 9:39 AM    [facility]

## 2017-07-05 ENCOUNTER — Ambulatory Visit (INDEPENDENT_AMBULATORY_CARE_PROVIDER_SITE_OTHER): Payer: PRIVATE HEALTH INSURANCE | Admitting: Cardiovascular Disease

## 2017-07-05 ENCOUNTER — Encounter: Payer: Self-pay | Admitting: Cardiovascular Disease

## 2017-07-05 DIAGNOSIS — Z79899 Other long term (current) drug therapy: Secondary | ICD-10-CM | POA: Diagnosis not present

## 2017-07-05 DIAGNOSIS — I10 Essential (primary) hypertension: Secondary | ICD-10-CM | POA: Diagnosis not present

## 2017-07-05 DIAGNOSIS — E7800 Pure hypercholesterolemia, unspecified: Secondary | ICD-10-CM | POA: Diagnosis not present

## 2017-07-05 MED ORDER — HYDROCHLOROTHIAZIDE 12.5 MG PO CAPS: 12.5000 mg | ORAL_CAPSULE | Freq: Every day | ORAL | 1 refills | Status: DC

## 2017-07-05 NOTE — Patient Instructions (Addendum)
Medication Instructions:  START HYDROCHLOROTHIAZIDE 12.5 MG DAILY  Labwork: BMET IN 1 WEEK  Testing/Procedures: NONE  Follow-Up: Your physician recommends that you schedule a follow-up appointment in: PHARM D FOR BLOOD PRESSURE IN 6 WEEKS  If blood pressure is good with Pharm D ok to follow up with Dr Oval Linsey as needed.

## 2017-07-06 IMAGING — US US EXTREM LOW VENOUS*L*
1 series · 13 of 24 positions shown · non-contrast
Comparison: None.

CLINICAL DATA: Left lower leg redness, swelling, and pain x2 days



[Series 1: us extrem low venous*left* · 0.09mm/px · 13 of 29 slices shown]
[im 1/29]
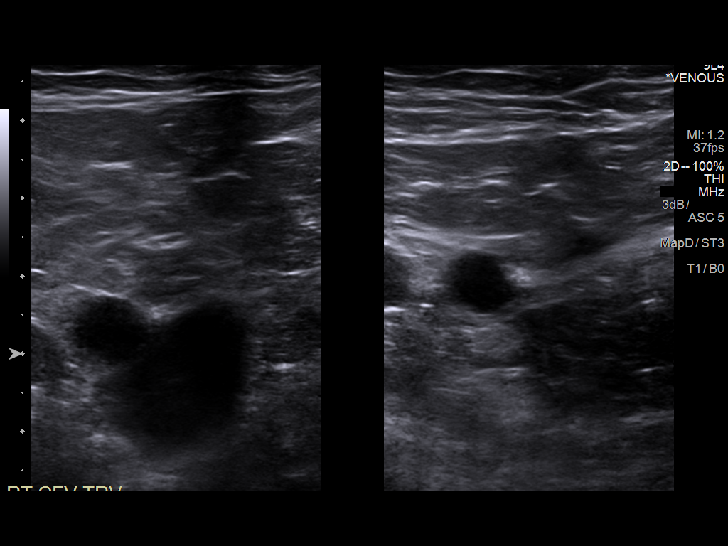
[im 3/29]
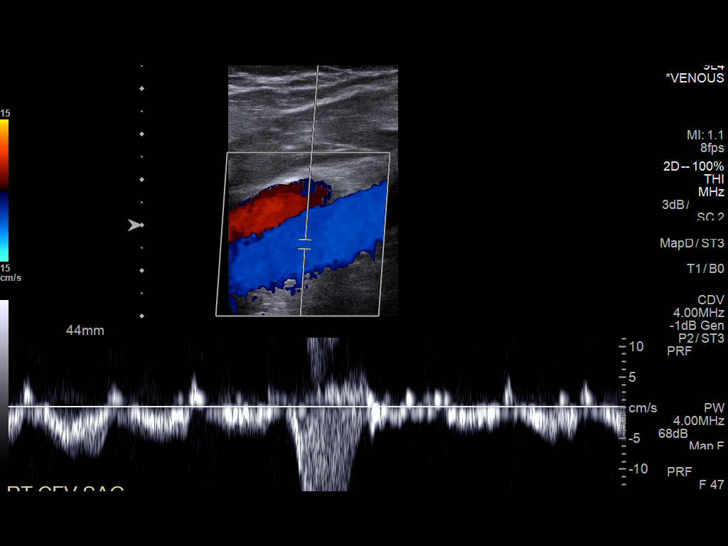
[im 5/29]
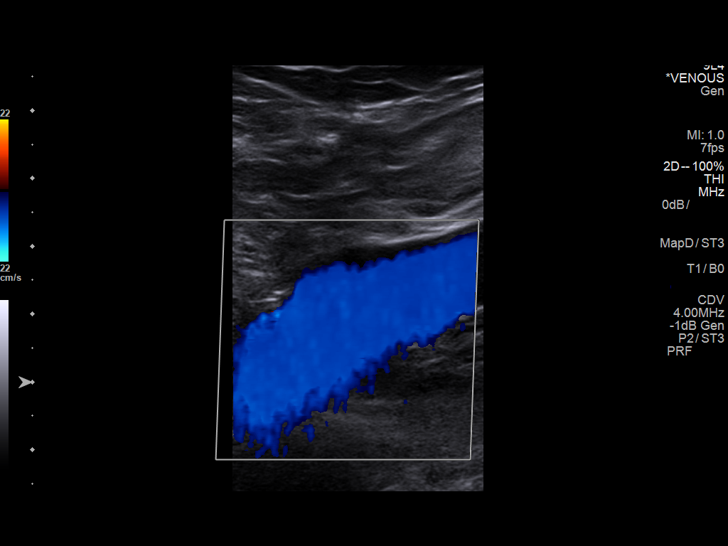
[im 8/29]
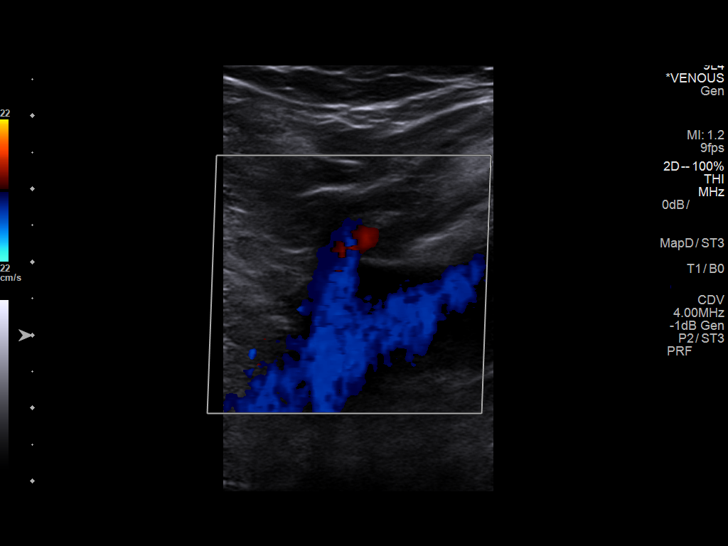
[im 10/29]
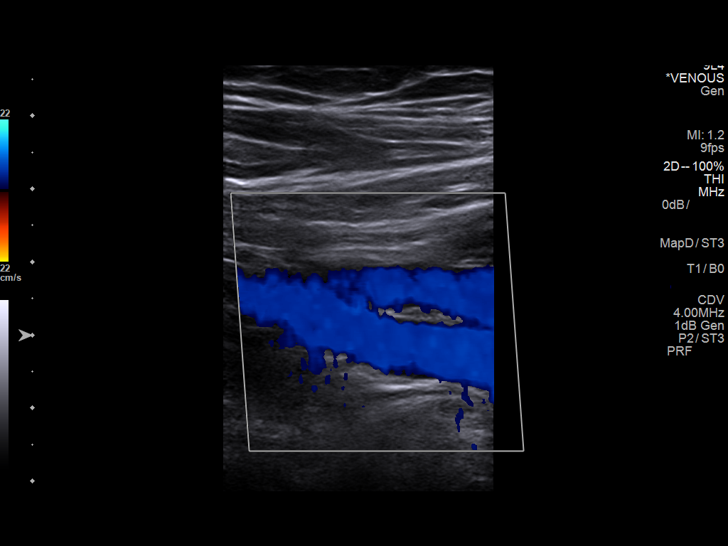
[im 13/29]
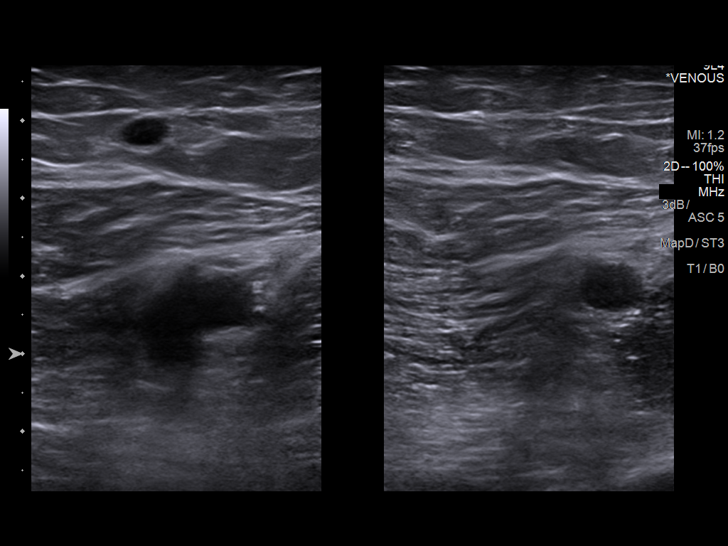
[im 15/29]
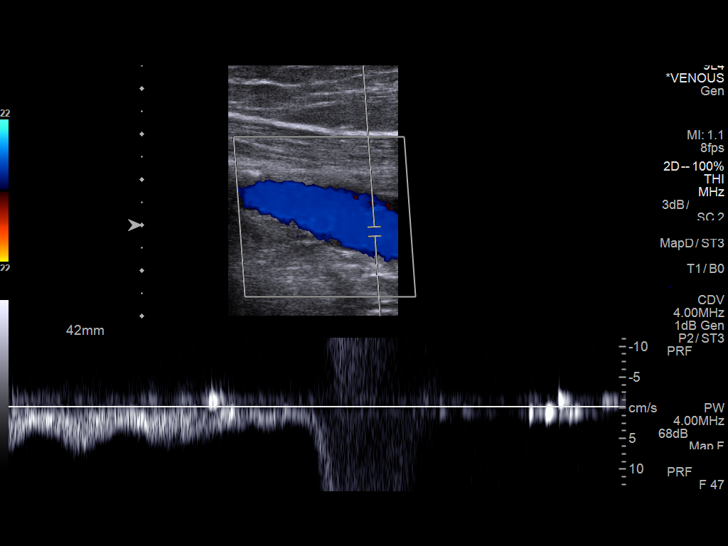
[im 16/29]
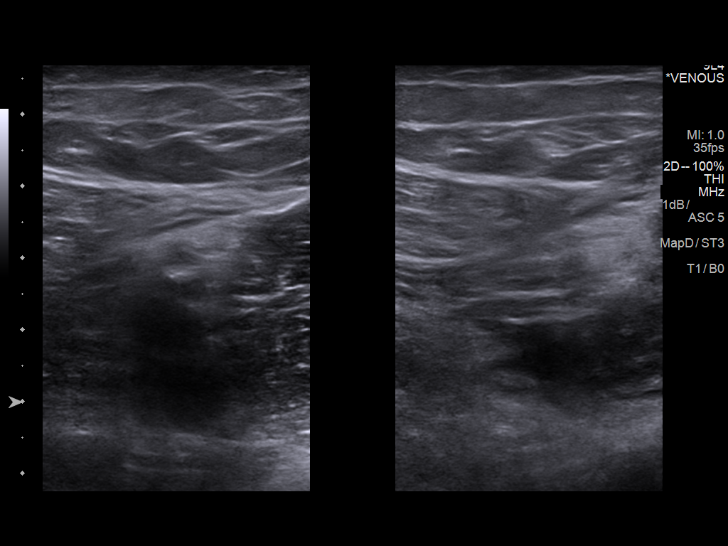
[im 19/29]
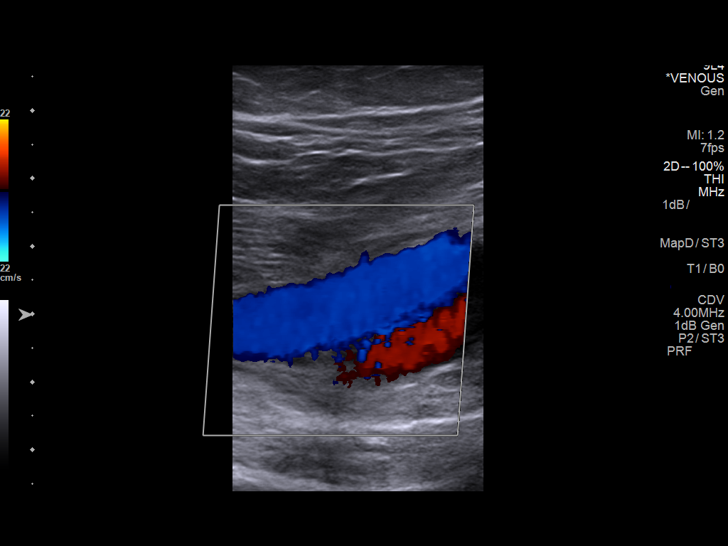
[im 21/29]
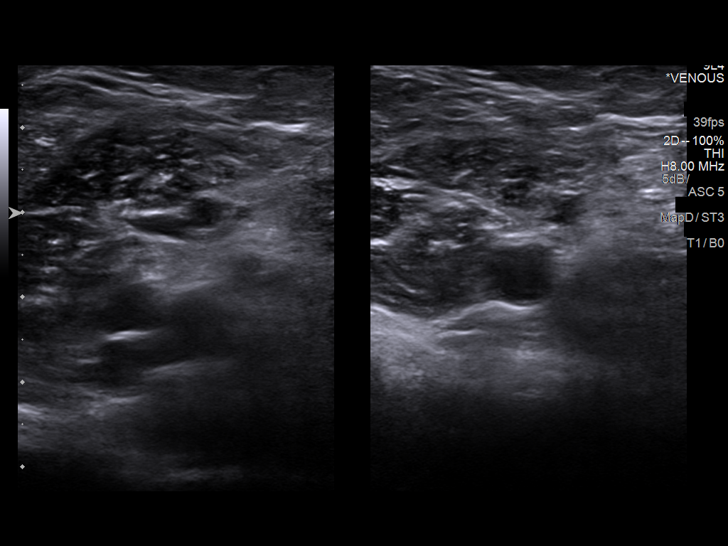
[im 24/29]
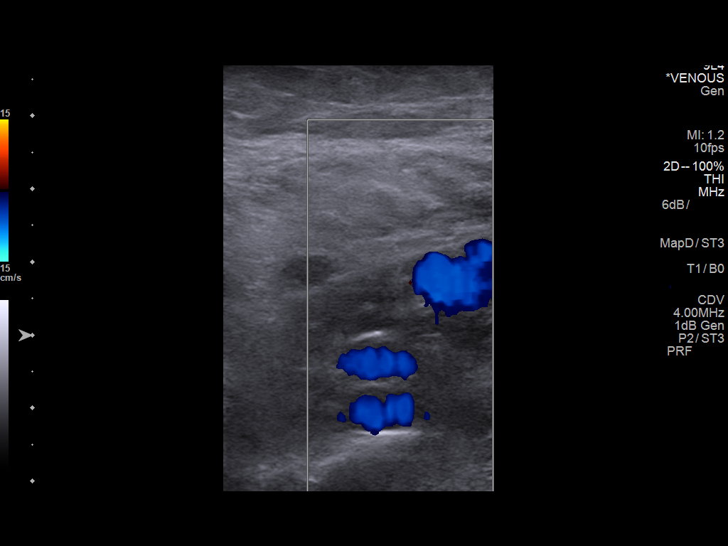
[im 26/29]
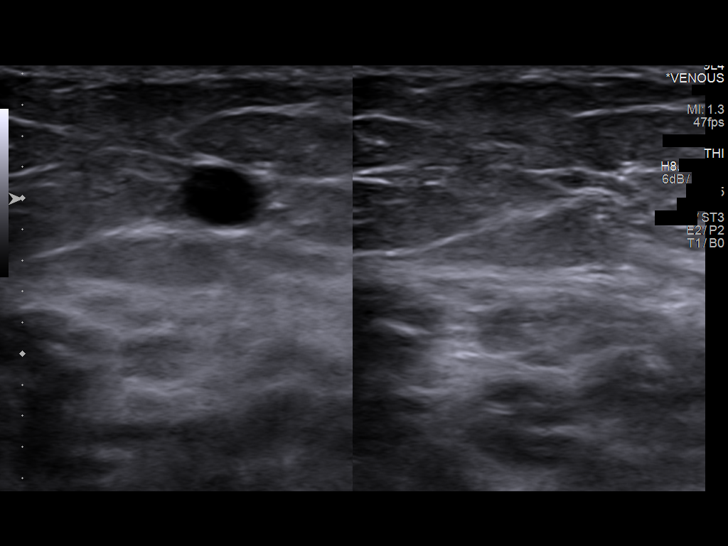
[im 29/29]
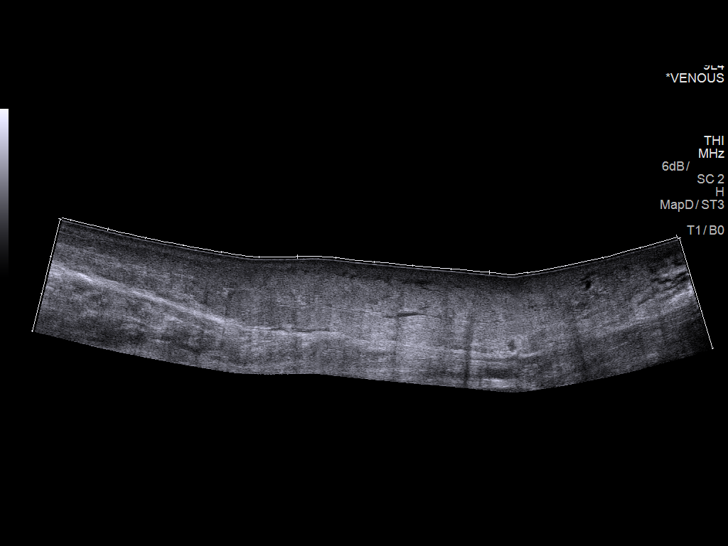

[13 of 24 positions shown; findings below may reference images not displayed]

FINDINGS: Contralateral Common Femoral Vein: Respiratory phasicity is normal
and symmetric with the symptomatic side. No evidence of thrombus.
Normal compressibility.

Common Femoral Vein: No evidence of thrombus. Normal
compressibility, respiratory phasicity and response to augmentation.

Saphenofemoral Junction: No evidence of thrombus. Normal
compressibility and flow on color Doppler imaging.

Profunda Femoral Vein: No evidence of thrombus. Normal
compressibility and flow on color Doppler imaging.

Femoral Vein: No evidence of thrombus. Normal compressibility,
respiratory phasicity and response to augmentation.

Popliteal Vein: No evidence of thrombus. Normal compressibility,
respiratory phasicity and response to augmentation.

Calf Veins: No evidence of thrombus. Normal compressibility and flow
on color Doppler imaging.

Superficial Great Saphenous Vein: No evidence of thrombus. Normal
compressibility and flow on color Doppler imaging.

Venous Reflux:  None.

Other Findings:  Mild subcutaneous edema at the ankle.
IMPRESSION: No evidence of DVT within the left lower extremity.

## 2017-07-21 LAB — BASIC METABOLIC PANEL
BUN/Creatinine Ratio: 12 (ref 10–24)
BUN: 9 mg/dL (ref 8–27)
CO2: 24 mmol/L (ref 20–29)
CREATININE: 0.78 mg/dL (ref 0.76–1.27)
Calcium: 9.6 mg/dL (ref 8.6–10.2)
Chloride: 102 mmol/L (ref 96–106)
GFR calc Af Amer: 110 mL/min/{1.73_m2} (ref >59)
GFR, EST NON AFRICAN AMERICAN: 95 mL/min/{1.73_m2} (ref >59)
Glucose: 88 mg/dL (ref 65–99)
Potassium: 4.1 mmol/L (ref 3.5–5.2)
SODIUM: 140 mmol/L (ref 134–144)

## 2017-08-16 ENCOUNTER — Other Ambulatory Visit: Payer: Self-pay | Admitting: Pharmacist Clinician (PhC)/ Clinical Pharmacy Specialist

## 2017-08-16 ENCOUNTER — Ambulatory Visit (INDEPENDENT_AMBULATORY_CARE_PROVIDER_SITE_OTHER): Payer: PRIVATE HEALTH INSURANCE | Admitting: Pharmacist Clinician (PhC)/ Clinical Pharmacy Specialist

## 2017-08-16 ENCOUNTER — Encounter: Payer: Self-pay | Admitting: Pharmacist Clinician (PhC)/ Clinical Pharmacy Specialist

## 2017-08-16 DIAGNOSIS — I10 Essential (primary) hypertension: Secondary | ICD-10-CM | POA: Diagnosis not present

## 2017-08-16 MED ORDER — HYDROCHLOROTHIAZIDE 25 MG PO TABS: 25.0000 mg | ORAL_TABLET | Freq: Every day | ORAL | 3 refills | Status: DC

## 2017-08-16 NOTE — Patient Instructions (Signed)
Return for a a follow up appointment in 2 months  Your blood pressure today is 164/84   Check your blood pressure at home daily and keep record of the readings.  Take your BP meds as follows:  Increase hydrochlorothiazide to 25 mg daily  Bring all of your meds, your BP cuff and your record of home blood pressures to your next appointment.  Exercise as you're able, try to walk approximately 30 minutes per day.  Keep salt intake to a minimum, especially watch canned and prepared boxed foods.  Eat more fresh fruits and vegetables and fewer canned items.  Avoid eating in fast food restaurants.    HOW TO TAKE YOUR BLOOD PRESSURE: . Rest 5 minutes before taking your blood pressure. .  Don't smoke or drink caffeinated beverages for at least 30 minutes before. . Take your blood pressure before (not after) you eat. . Sit comfortably with your back supported and both feet on the floor (don't cross your legs). . Elevate your arm to heart level on a table or a desk. . Use the proper sized cuff. It should fit smoothly and snugly around your bare upper arm. There should be enough room to slip a fingertip under the cuff. The bottom edge of the cuff should be 1 inch above the crease of the elbow. . Ideally, take 3 measurements at one sitting and record the average.

## 2017-08-16 NOTE — Progress Notes (Signed)
     08/16/2017 Ronald Peters September 23, 1951 [id number]   HPI:  Ronald Peters is a 66 y.o. male patient of Dr Oval Linsey, with a [place] below who presents today for hypertension clinic evaluation.  His medical history is significant for hypertension, hyperlipidemia and OSA with CPAP.   He had an episode of cellulitis after a long flight to [place] that required antibiotics.  He continues to note mild lower extremity edema after being on his feet all day, but improves with elevation.    Blood Pressure Goal:  130/80  Current Medications:  HCTZ 12.5 mg qd am  Family Hx:  Father died of CHF at 72 (was gored by bull 18 years prior with multiple broken ribs, breastbone; had heart problems develop after that.  No other family cardiology issues  Social Hx:  Quit tobacco approx 23 years ago; cocktails 5x per week (gin/tonic); caffeine consumption down to 2 coffee per day, rare soda  Diet:  Eats out more than home, mostly [place] or [place], eats all meats, but mostly chicken/fish, occasional pan fried;   Cut back on salt, switched to potassium salt substitute  Exercise:  None, rather sedentary.  Home BP readings:  Home cuff, taken just a few times; mornings were high 130/80's, still unchanged.  Last night 128/70; this am 129/81  Intolerances  none Wt Readings from Last 3 Encounters:  07/05/17 282 lb (127.9 kg)  05/15/17 283 lb 2 oz (128.4 kg)  04/12/17 286 lb (129.7 kg)   BP Readings from Last 3 Encounters:  07/05/17 (!) 146/86  05/15/17 138/87  04/12/17 (!) 151/85   Pulse Readings from Last 3 Encounters:  07/05/17 64  05/15/17 65  04/12/17 88    Current Outpatient Prescriptions  Medication Sig Dispense Refill  . aspirin EC 81 MG tablet Take 81 mg by mouth daily.    . hydrochlorothiazide (MICROZIDE) 12.5 MG capsule Take 1 capsule (12.5 mg total) by mouth daily. 90 capsule 1  . loratadine (CLARITIN) 10 MG tablet Take 10 mg by mouth daily.    . Multiple Vitamins-Minerals (MULTIVITAMIN ADULT  PO) Take by mouth.    . Omega-3 Fatty Acids (FISH OIL PO) Take 1 tablet by mouth daily.    . pravastatin (PRAVACHOL) 10 MG tablet Take 10 mg by mouth daily.    . PSYLLIUM HUSK PO Take 12 g by mouth daily.     No current facility-administered medications for this visit.     Allergies  Allergen Reactions  . Simvastatin Other (See Comments)    elevated liver enzymes     Past Medical History:  Diagnosis Date  . Atypical chest pain 04/17/2017  . Chest pain   . Essential hypertension 04/17/2017  . Hypercholesteremia   . Hypertension   . Pre-diabetes   . Sleep apnea    134/84   144/84   134/86 There were no vitals taken for this visit.  No problem-specific Assessment & Plan notes found for this encounter.   Tommy Medal PharmD CPP [facility]

## 2017-08-16 NOTE — Assessment & Plan Note (Signed)
Patient with new onset hypertension, mostly controlled with the hydrochlorothiazide 12.5 mg daily.  Will increase to 25 mg daily and have him continue with home BP checks.  Will follow up in 2 months.

## 2017-09-08 ENCOUNTER — Encounter: Payer: Self-pay | Admitting: Cardiovascular Disease

## 2017-09-18 ENCOUNTER — Encounter: Payer: Self-pay | Admitting: Cardiovascular Disease

## 2017-10-12 ENCOUNTER — Ambulatory Visit: Payer: PRIVATE HEALTH INSURANCE

## 2017-10-16 IMAGING — CR DG CHEST 2V
2 series · 2 of 2 positions shown · non-contrast
Comparison: Prior radiograph from 11/26/2015.

CLINICAL DATA: Initial evaluation for acute chest pressure.

EXAM:
CHEST  2 VIEW

[chest pa]
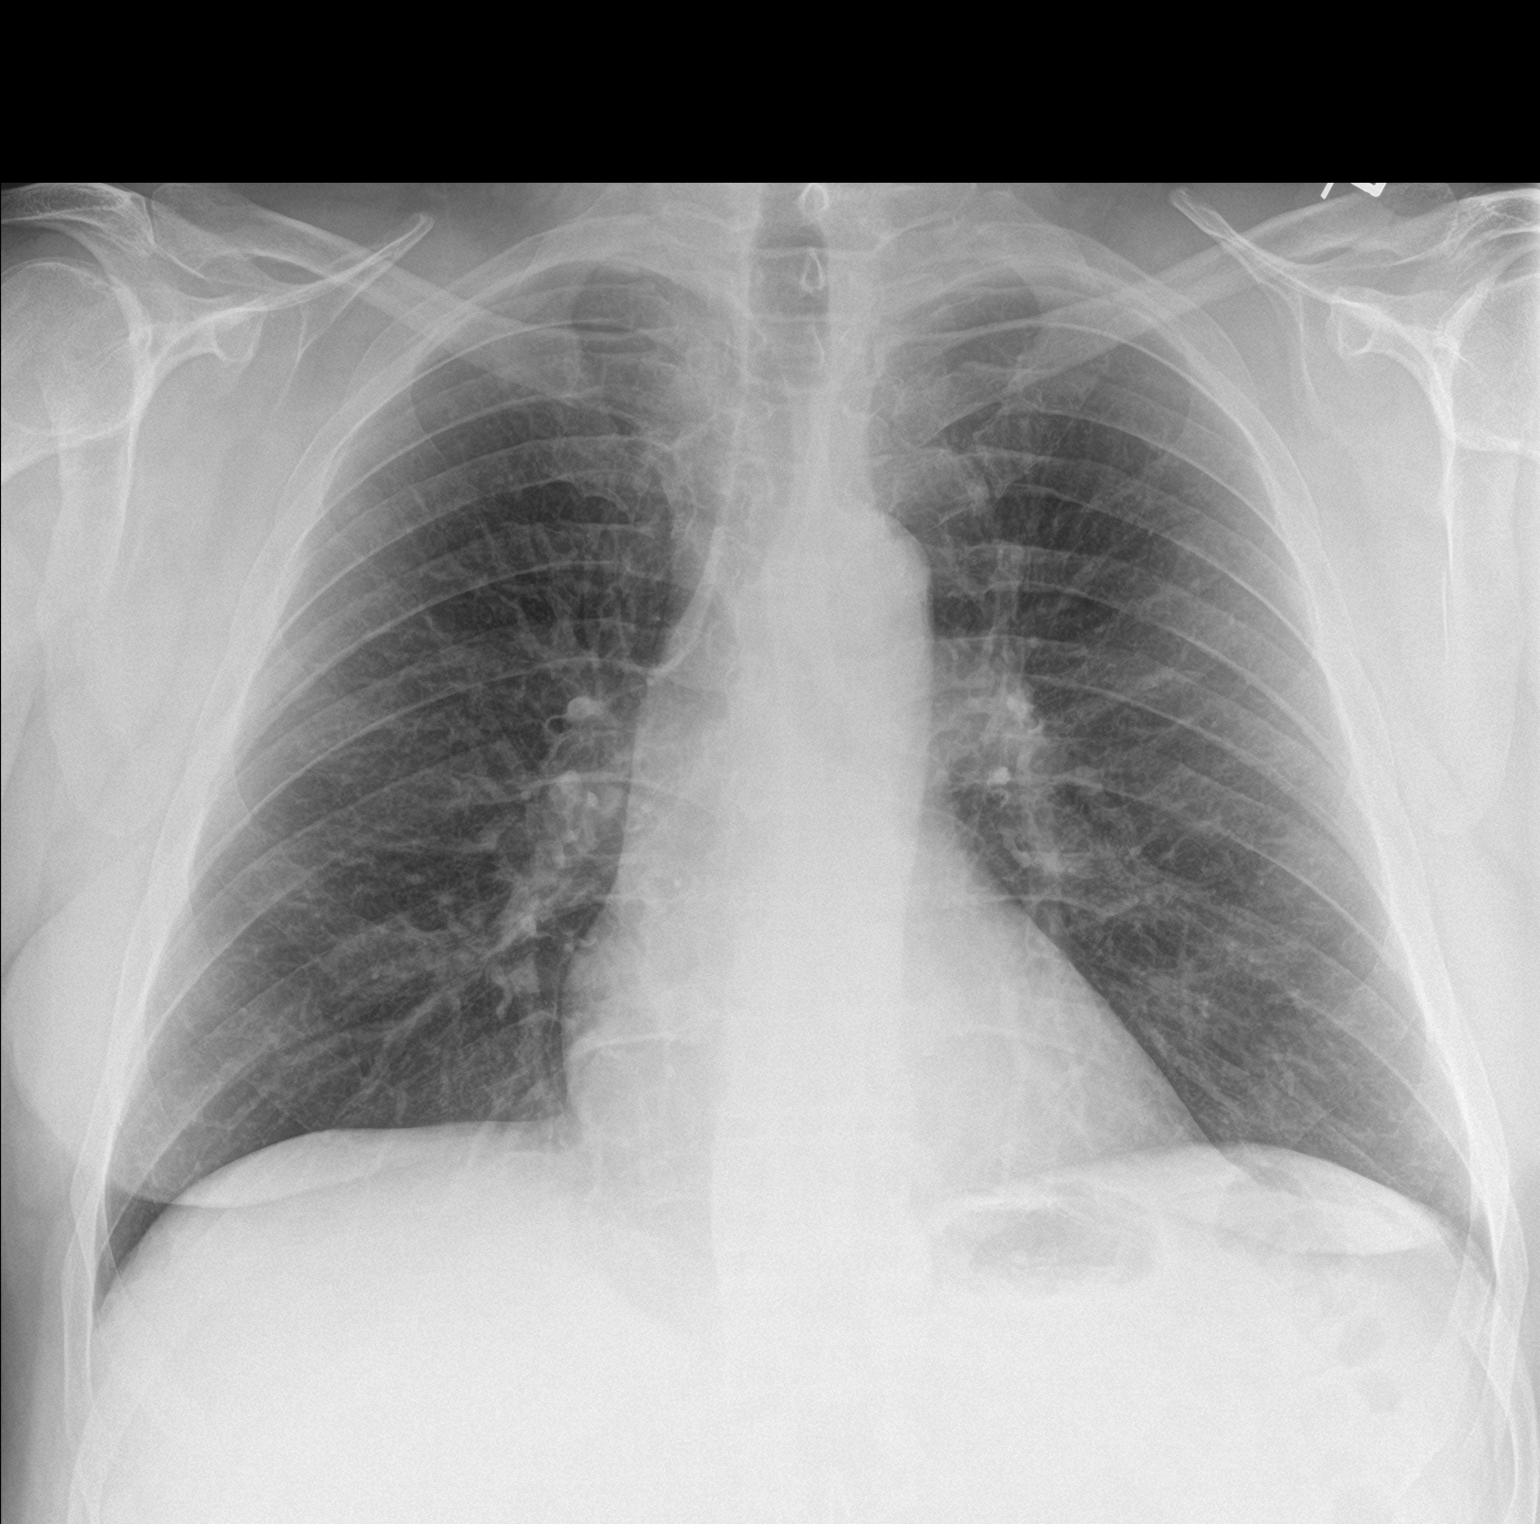

[chest lat]
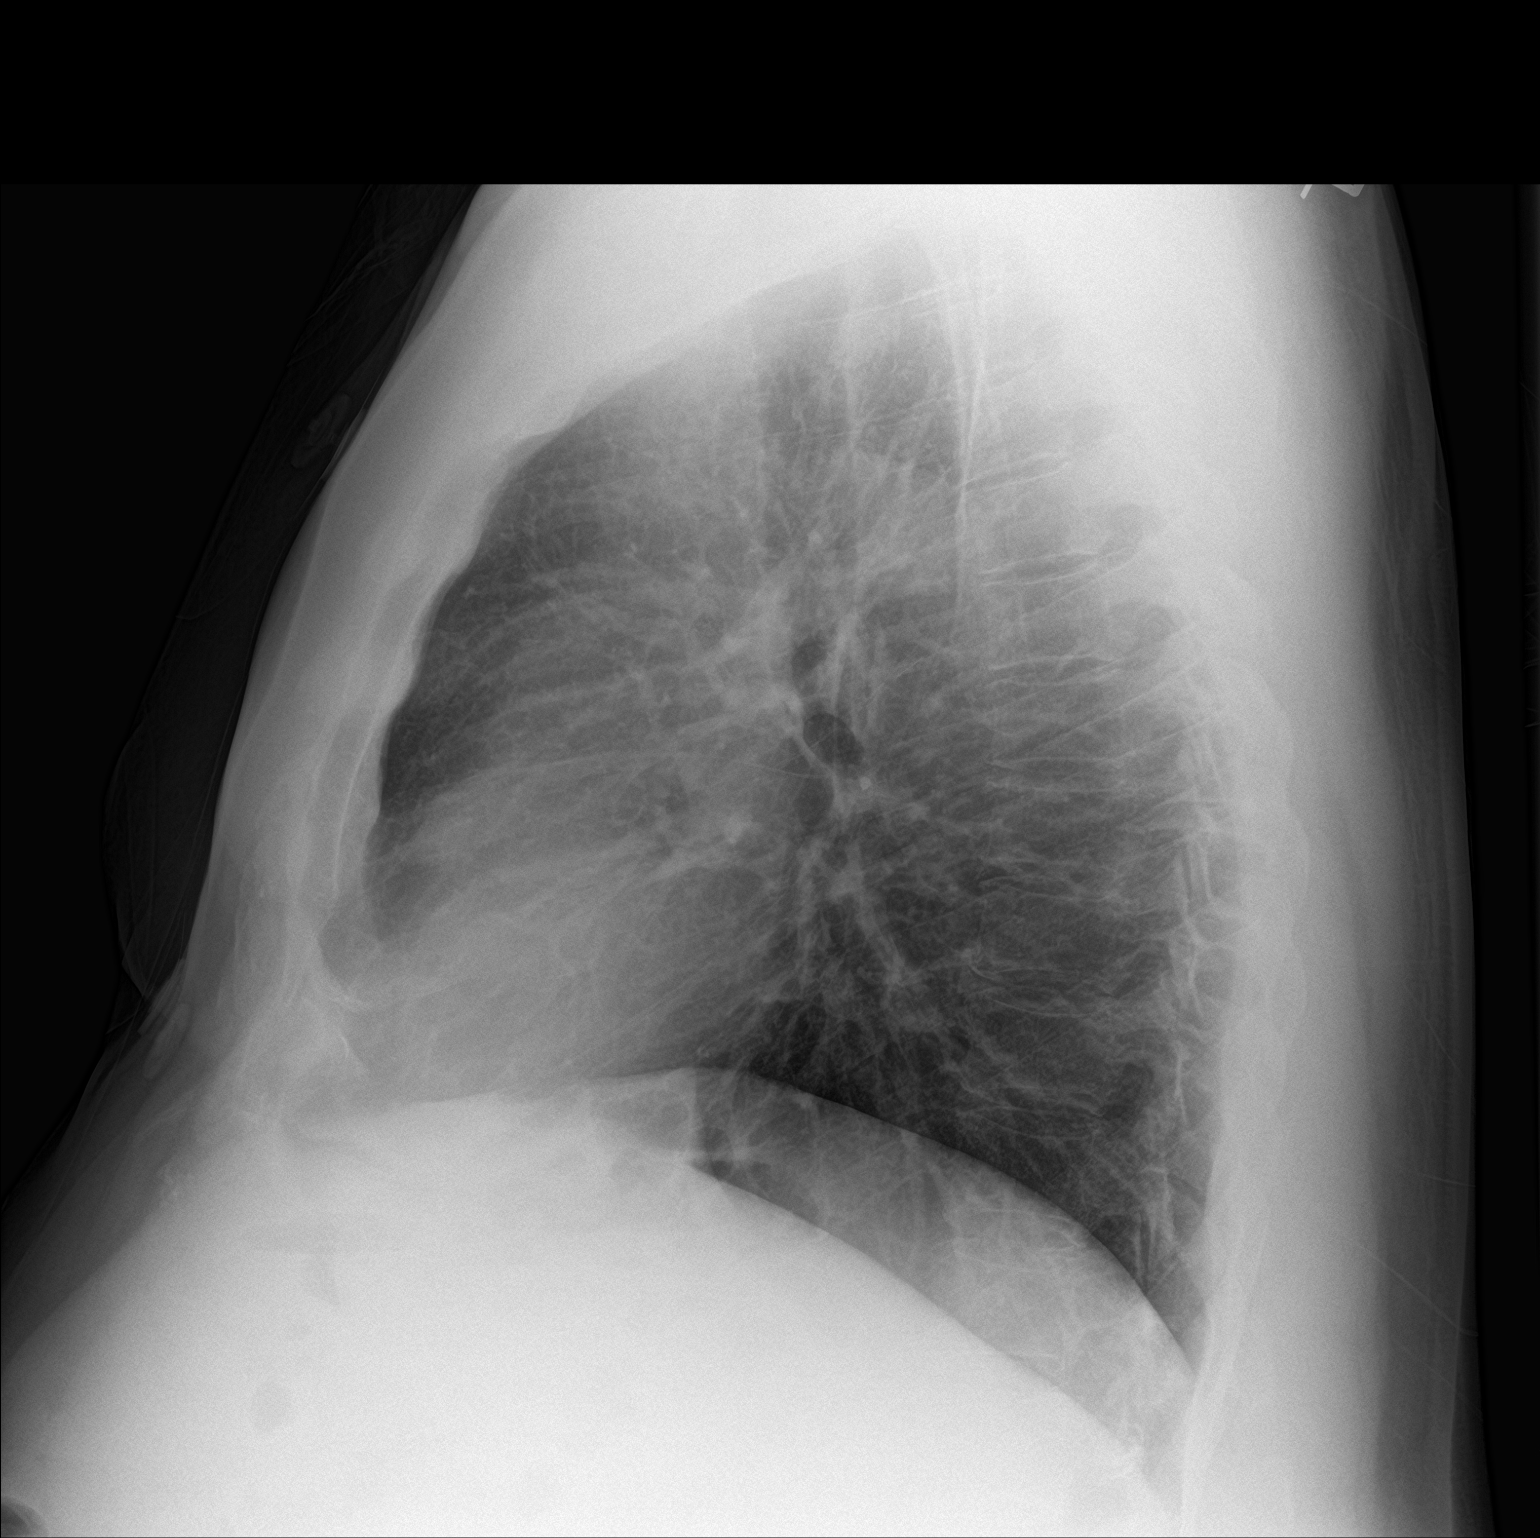

[2 of 2 positions shown; findings below may reference images not displayed]

FINDINGS: The cardiac and mediastinal silhouettes are stable in size and
contour, and remain within normal limits.

The lungs are normally inflated. No airspace consolidation, pleural
effusion, or pulmonary edema is identified. There is no
pneumothorax.

No acute osseous abnormality identified.
IMPRESSION: No active cardiopulmonary disease.

## 2018-09-06 ENCOUNTER — Other Ambulatory Visit: Payer: Self-pay | Admitting: Cardiovascular Disease

## 2018-11-23 ENCOUNTER — Other Ambulatory Visit: Payer: Self-pay | Admitting: Cardiovascular Disease

## 2019-02-21 ENCOUNTER — Other Ambulatory Visit: Payer: Self-pay | Admitting: Cardiovascular Disease

## 2019-05-22 ENCOUNTER — Other Ambulatory Visit: Payer: Self-pay | Admitting: Cardiovascular Disease

## 2019-06-05 ENCOUNTER — Other Ambulatory Visit: Payer: Self-pay | Admitting: Family Medicine

## 2019-06-05 DIAGNOSIS — Z87891 Personal history of nicotine dependence: Secondary | ICD-10-CM

## 2019-06-07 ENCOUNTER — Ambulatory Visit
Admission: RE | Admit: 2019-06-07 | Discharge: 2019-06-07 | Disposition: A | Discharge status: Home / Self Care | Payer: PRIVATE HEALTH INSURANCE | Source: Ambulatory Visit | Attending: Family Medicine | Admitting: Family Medicine

## 2019-06-07 DIAGNOSIS — Z87891 Personal history of nicotine dependence: Secondary | ICD-10-CM

## 2019-06-18 ENCOUNTER — Ambulatory Visit: Payer: PRIVATE HEALTH INSURANCE

## 2019-08-03 ENCOUNTER — Other Ambulatory Visit: Payer: Self-pay | Admitting: Cardiovascular Disease

## 2019-08-08 ENCOUNTER — Other Ambulatory Visit: Payer: Self-pay | Admitting: Cardiovascular Disease

## 2019-08-09 ENCOUNTER — Other Ambulatory Visit: Payer: Self-pay | Admitting: Cardiovascular Disease

## 2019-08-09 MED ORDER — HYDROCHLOROTHIAZIDE 25 MG PO TABS: 25.0000 mg | ORAL_TABLET | Freq: Every day | ORAL | 1 refills | Status: DC

## 2019-08-09 NOTE — Telephone Encounter (Signed)
Pt requesting a refill on HCTZ 25 mg tablet. Pt has an appt in November, pt has not been seen since 2018. Would Dr. Oval Linsey like to refill this medication until pt's appt in November. Please address

## 2019-08-09 NOTE — Telephone Encounter (Signed)
°*  STAT* If patient is at the pharmacy, call can be transferred to refill team.   1. Which medications need to be refilled? (please list name of each medication and dose if known)  HCTZ  25 mg  2. Which pharmacy/location (including street and city if local pharmacy) is medication to be sent to? [facility]   3. Do they need a 30 day or 90 day supply? Patient would like 90

## 2019-08-09 NOTE — Telephone Encounter (Signed)
Refill sent to the pharmacy electronically.  

## 2019-08-12 NOTE — Telephone Encounter (Signed)
Rx(s) sent to pharmacy electronically.  

## 2019-10-29 ENCOUNTER — Encounter: Payer: Self-pay | Admitting: Cardiovascular Disease

## 2019-10-29 ENCOUNTER — Telehealth (INDEPENDENT_AMBULATORY_CARE_PROVIDER_SITE_OTHER): Payer: PRIVATE HEALTH INSURANCE | Admitting: Cardiovascular Disease

## 2019-10-29 DIAGNOSIS — R079 Chest pain, unspecified: Secondary | ICD-10-CM | POA: Diagnosis not present

## 2019-10-29 DIAGNOSIS — E785 Hyperlipidemia, unspecified: Secondary | ICD-10-CM | POA: Diagnosis not present

## 2019-10-29 DIAGNOSIS — I10 Essential (primary) hypertension: Secondary | ICD-10-CM | POA: Diagnosis not present

## 2019-10-29 NOTE — Patient Instructions (Signed)
Medication Instructions:  Your physician recommends that you continue on your current medications as directed. Please refer to the Current Medication list given to you today.  *If you need a refill on your cardiac medications before your next appointment, please call your pharmacy*  Lab Work: NONE  Testing/Procedures: NONE  Follow-Up: At [facility], you and your health needs are our priority.  As part of our continuing mission to provide you with exceptional heart care, we have created designated Provider Care Teams.  These Care Teams include your primary Cardiologist (physician) and Advanced Practice Providers (APPs -  Physician Assistants and Nurse Practitioners) who all work together to provide you with the care you need, when you need it.  Your next appointment:   6 months  The format for your next appointment:   Virtual Visit   Provider:   You may see DR [facility] or one of the following Advanced Practice Providers on your designated Care Team:    Kerin Ransom, PA-C  [place], [place]  Coletta Memos, [place]   Other Instructions   TRY TO EXERCISE [place]

## 2019-10-29 NOTE — Progress Notes (Signed)
Cardiology Office Note  This visit type was conducted due to national recommendations for restrictions regarding the COVID-19 Pandemic (e.g. social distancing) in an effort to limit this patient's exposure and mitigate transmission in our community.  Due to her co-morbid illnesses, this patient is at least at moderate risk for complications without adequate follow up.  This format is felt to be most appropriate for this patient at this time.  All issues noted in this document were discussed and addressed.  A limited physical exam was performed with this format.  Please refer to the patient's chart for her consent to telehealth for [facility].   Patient location: home Provider location: home   Date:  10/29/2019   ID:  Ronald Peters, DOB 1951/03/11, MRN [id number]  PCP:  Hulan Fess, MD  Cardiologist:   Skeet Latch, MD   No chief complaint on file.    History of Present Illness: Ronald Peters is a 68 y.o. male with hypertension, hyperlipidemia, and OSA on CPAP here for follow up.  Ronald Peters was first seen 03/2017 for chest tightness.  Symptoms were thought to be consistent with esophageal spasm. He was started on a PPI and referred to cardiology for further evaluation.  Ronald Peters was referred for an ETT 04/2017 that was negative for ischemia.  He achieved 7 METS on a Bruce protocol. Since that time he was seen in the emergency department 04/2017 with leg swelling.  This occurred after a flight to [place].  He had lower extremity Dopplers that were negative for DVT.  He developed cellulitis requiring antibiotics.    Ronald Peters followed up with our pharmacist 07/2017 and HCTZ was increased to 25 mg.  Lately he has been feeling very well.  He had gained 10 pounds but has now lost it.  He stopped eating after dinner and is trying to limit his portions.  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  They plan to start walking for exercise daily.  Right now he does not get any  formal exercise but has sporadic periods of increased activity with lawn work and helping his son move.  He has no exertional chest pain or shortness of breath with these activities.  He denies lower extremity edema, orthopnea, or PND ND.  He is scheduled to have labs checked with his PCP the first week in December.   Past Medical History:  Diagnosis Date  . Atypical chest pain 04/17/2017  . Chest pain   . Essential hypertension 04/17/2017  . Hypercholesteremia   . Hypertension   . Pre-diabetes   . Sleep apnea     Past Surgical History:  Procedure Laterality Date  . COLONOSCOPY W/ BIOPSIES AND POLYPECTOMY    . VASECTOMY    . WISDOM TOOTH EXTRACTION       Current Outpatient Medications  Medication Sig Dispense Refill  . aspirin EC 81 MG tablet Take 81 mg by mouth daily.    . hydrochlorothiazide (HYDRODIURIL) 25 MG tablet TAKE 1 TABLET(25 MG) BY MOUTH DAILY 90 tablet 0  . loratadine (CLARITIN) 10 MG tablet Take 10 mg by mouth daily.    . Multiple Vitamins-Minerals (MULTIVITAMIN ADULT PO) Take by mouth.    . Omega-3 Fatty Acids (FISH OIL PO) Take 1 tablet by mouth daily.    . pravastatin (PRAVACHOL) 10 MG tablet Take 10 mg by mouth daily.    . PSYLLIUM HUSK PO Take 12 g by mouth daily.  No current facility-administered medications for this visit.     Allergies:   Simvastatin    Social History:  The patient  reports that he quit smoking about 25 years ago. His smoking use included cigarettes. He has a 50.00 pack-year smoking history. He has never used smokeless tobacco. He reports current alcohol use. He reports that he does not use drugs.   Family History:  The patient's family history includes Atrial fibrillation in his mother; Cancer in his paternal aunt; Colon cancer in his maternal grandmother; Heart disease in his father; Heart failure in his father; Hypertension in his mother; Stroke in his father and mother.    ROS:  Please see the history of present illness.    Otherwise, review of systems are positive for none.   All other systems are reviewed and negative.     PHYSICAL EXAM: VS:  BP 135/84   Pulse 67   Ht 5' 10.5" (1.791 m)   Wt 281 lb (127.5 kg)   BMI 39.75 kg/m  , BMI Body mass index is 39.75 kg/m. GENERAL:  Well appearing.  No acute distress HEENT:  Pupils equal round and reactive, fundi not visualized, oral mucosa unremarkable NECK:  No jugular venous distention LUNGS: Respirations unlabored EXT: No edema SKIN:  No rashes no nodules NEURO:  Cranial nerves II through XII grossly intact, motor grossly intact throughout PSYCH:  Cognitively intact, oriented to person place and time   EKG:  EKG is not ordered today. The ekg ordered 03/09/17 demonstrates sinus rhythm. Rate 61 bpm.  ETT 05/10/17:  Blood pressure demonstrated a hypertensive response to exercise.  There was no ST segment deviation noted during stress.  Negative, adequate stress test.   Recent Labs: No results found for requested labs within last 8760 hours.   12/22/16: A 5.6, hemoglobin 15.3, hematocrit 44.9, platelets 158 sodium 140, potassium 4.3, BUN 15, creatinine 0 point AST 28, ALT 45 Cholesterol 188, trig17, HDL 37, LDL 117 TSH 1.69 Hemoglobin A1c 5.4%  05/16/2019: Total cholesterol 86, triglycerides 228, HDL 37, LDL 103 Hemoglobin A1c 5.6% Hemoglobin 14.7 Creatinine 0.91 TSH 0.99  Lipid Panel No results found for: CHOL, TRIG, HDL, CHOLHDL, VLDL, LDLCALC, LDLDIRECT    Wt Readings from Last 3 Encounters:  10/29/19 281 lb (127.5 kg)  07/05/17 282 lb (127.9 kg)  05/15/17 283 lb 2 oz (128.4 kg)      ASSESSMENT AND PLAN:  # Chest pain: Symptoms have resolved. ETT was negative for ischemia.  # Hypertension:  Blood pressure is minimally elevated.  We discussed the fact that his blood pressure could likely be controlled to less than 130/80 with increased exercise and can dietary interventions.  Recommended that he limit his salt intake.  Continue  HCTZ 25 mg daily.  # Hyperlipidemia:  Continue pravastatin and fish oil.  LDL 103 on 04/2019.  Current medicines are reviewed at length with the patient today.  The patient does not have concerns regarding medicines.  The following changes have been made:  no change  Labs/ tests ordered today include:   No orders of the defined types were placed in this encounter.    Disposition:   FU with Berea Majkowski C. Oval Linsey, MD, [facility] in 6 months.   This note was written with the assistance of speech recognition software.  Please excuse any transcriptional errors.  Signed, Marcena Dias C. Oval Linsey, MD, [facility]  10/29/2019 3:20 PM    [facility]

## 2019-11-05 NOTE — Progress Notes (Signed)
I apologize.  The codes for this visit are essential hypertension, pure hypercholesterolemia and atypical chest pain.  Thank you.

## 2019-12-04 ENCOUNTER — Other Ambulatory Visit: Payer: Self-pay

## 2019-12-04 ENCOUNTER — Ambulatory Visit: Payer: PRIVATE HEALTH INSURANCE | Attending: Internal Medicine

## 2019-12-04 DIAGNOSIS — Z20822 Contact with and (suspected) exposure to covid-19: Secondary | ICD-10-CM

## 2019-12-24 LAB — NOVEL CORONAVIRUS, NAA: SARS-CoV-2, NAA: NOT DETECTED

## 2019-12-26 DIAGNOSIS — E782 Mixed hyperlipidemia: Secondary | ICD-10-CM | POA: Diagnosis not present

## 2019-12-26 DIAGNOSIS — K760 Fatty (change of) liver, not elsewhere classified: Secondary | ICD-10-CM | POA: Diagnosis not present

## 2019-12-26 DIAGNOSIS — Z1159 Encounter for screening for other viral diseases: Secondary | ICD-10-CM | POA: Diagnosis not present

## 2019-12-26 DIAGNOSIS — G4730 Sleep apnea, unspecified: Secondary | ICD-10-CM | POA: Diagnosis not present

## 2019-12-26 DIAGNOSIS — R7301 Impaired fasting glucose: Secondary | ICD-10-CM | POA: Diagnosis not present

## 2019-12-26 DIAGNOSIS — I10 Essential (primary) hypertension: Secondary | ICD-10-CM | POA: Diagnosis not present

## 2019-12-26 DIAGNOSIS — Z0000 Encounter for general adult medical examination without abnormal findings: Secondary | ICD-10-CM | POA: Diagnosis not present

## 2019-12-26 DIAGNOSIS — Z86010 Personal history of colonic polyps: Secondary | ICD-10-CM | POA: Diagnosis not present

## 2020-01-14 IMAGING — US US ABDOMINAL AORTA SCREENING AAA
1 series · 10 of 10 positions shown · non-contrast
Comparison: None.

CLINICAL DATA: Abdominal aortic aneurysm screening.

EXAM:
US ABDOMINAL AORTA MEDICARE SCREENING
TECHNIQUE: Ultrasound examination of the abdominal aorta was performed as a
screening evaluation for abdominal aortic aneurysm.

[Series 1: us abdominal aorta screening aaa · 0.29mm/px · 10 of 10 slices shown]
[im 1/10]
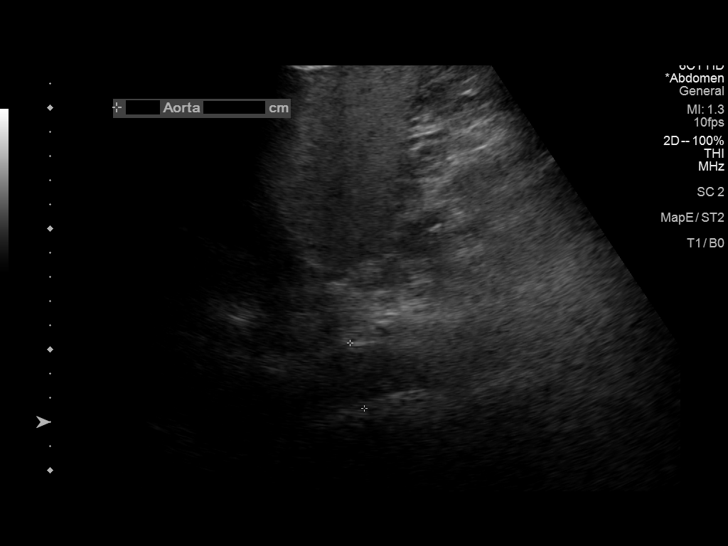
[im 2/10]
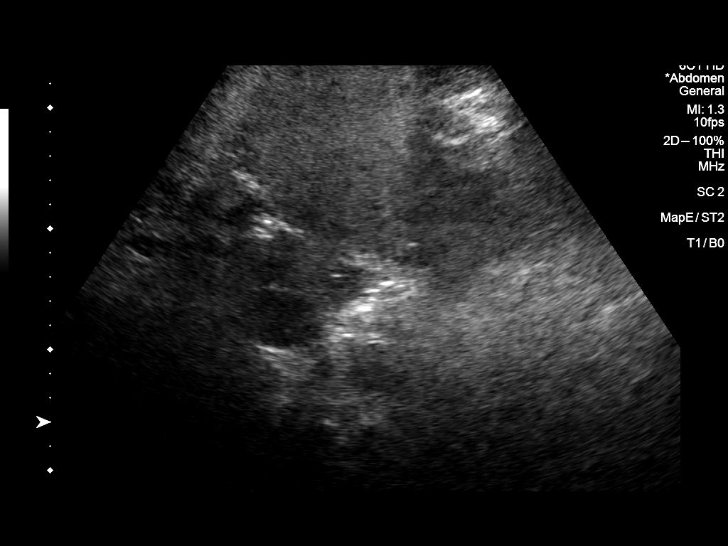
[im 3/10]
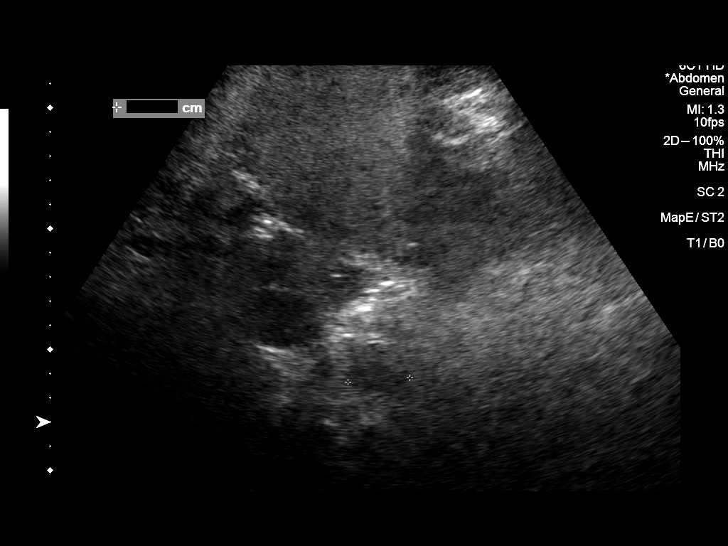
[im 4/10]
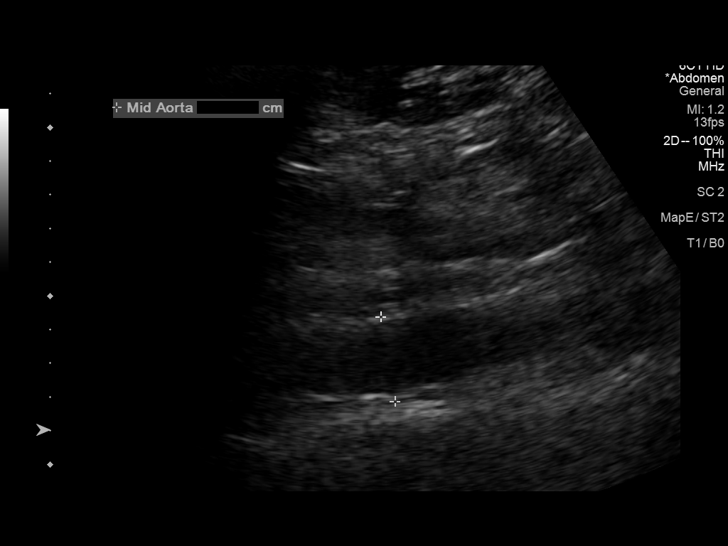
[im 5/10]
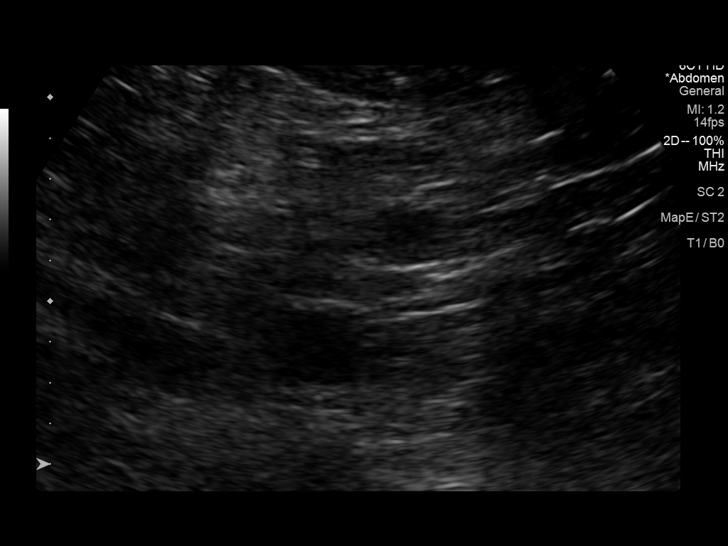
[im 6/10]
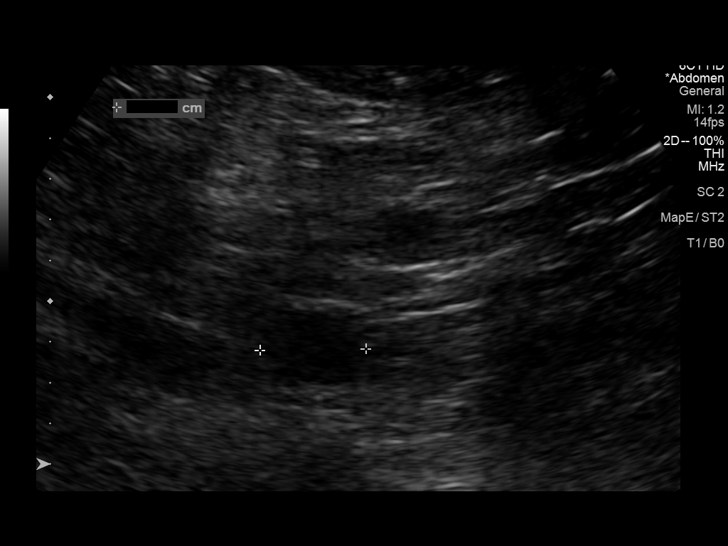
[im 7/10]
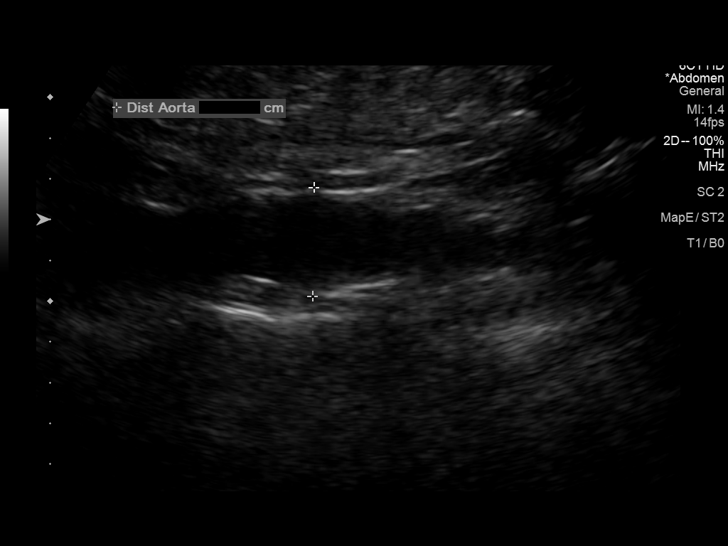
[im 8/10]
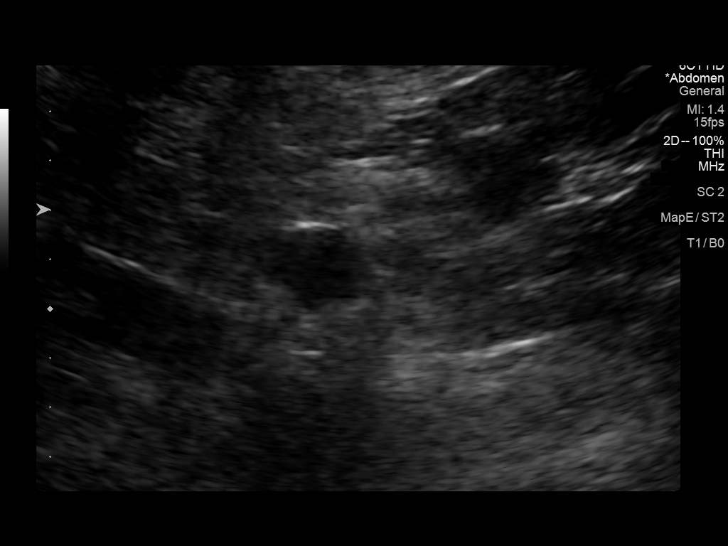
[im 9/10]
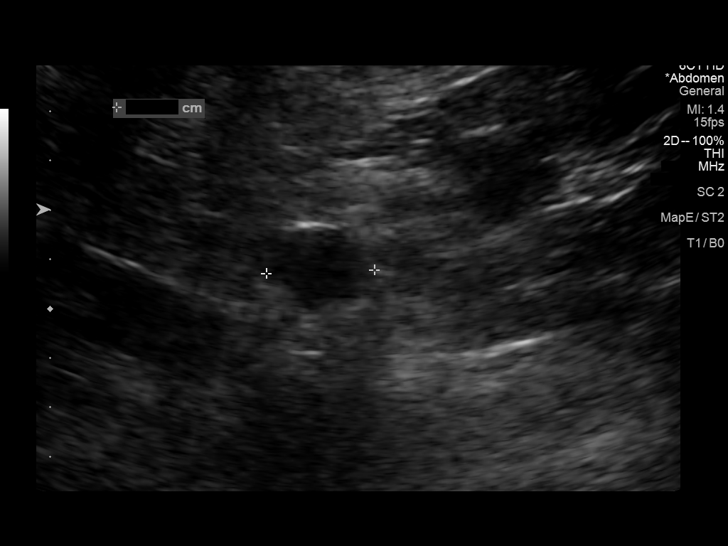
[im 10/10]
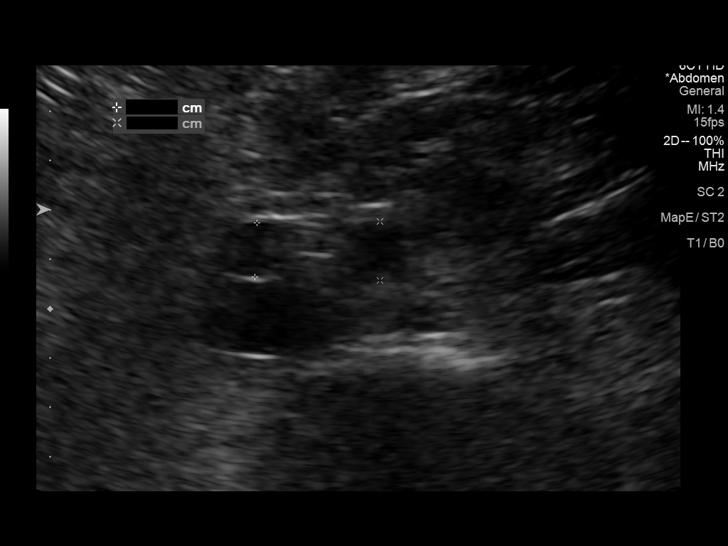

[10 of 10 positions shown; findings below may reference images not displayed]

FINDINGS: Abdominal aortic measurements as follows:

Proximal:  2.8 x 2.6 cm

Mid:  2.6 x 2.6 cm

Distal:  2.7 x 2.2 cm
IMPRESSION: Normal exam.

## 2020-01-29 ENCOUNTER — Other Ambulatory Visit: Payer: Self-pay | Admitting: Cardiovascular Disease

## 2020-01-31 ENCOUNTER — Other Ambulatory Visit: Payer: Self-pay

## 2020-01-31 MED ORDER — HYDROCHLOROTHIAZIDE 25 MG PO TABS: 25.0000 mg | ORAL_TABLET | Freq: Every day | ORAL | 2 refills | Status: DC

## 2020-02-03 ENCOUNTER — Other Ambulatory Visit: Payer: Self-pay

## 2020-03-12 DIAGNOSIS — Z86010 Personal history of colonic polyps: Secondary | ICD-10-CM | POA: Diagnosis not present

## 2020-03-12 DIAGNOSIS — K219 Gastro-esophageal reflux disease without esophagitis: Secondary | ICD-10-CM | POA: Diagnosis not present

## 2020-03-12 DIAGNOSIS — R1310 Dysphagia, unspecified: Secondary | ICD-10-CM | POA: Diagnosis not present

## 2020-03-12 DIAGNOSIS — R110 Nausea: Secondary | ICD-10-CM | POA: Diagnosis not present

## 2020-03-25 DIAGNOSIS — R1310 Dysphagia, unspecified: Secondary | ICD-10-CM | POA: Diagnosis not present

## 2020-03-25 DIAGNOSIS — K2270 Barrett's esophagus without dysplasia: Secondary | ICD-10-CM | POA: Diagnosis not present

## 2020-03-25 DIAGNOSIS — K2980 Duodenitis without bleeding: Secondary | ICD-10-CM | POA: Diagnosis not present

## 2020-03-25 DIAGNOSIS — K317 Polyp of stomach and duodenum: Secondary | ICD-10-CM | POA: Diagnosis not present

## 2020-03-25 DIAGNOSIS — K219 Gastro-esophageal reflux disease without esophagitis: Secondary | ICD-10-CM | POA: Diagnosis not present

## 2020-04-24 DIAGNOSIS — Z86010 Personal history of colonic polyps: Secondary | ICD-10-CM | POA: Diagnosis not present

## 2020-04-24 DIAGNOSIS — K219 Gastro-esophageal reflux disease without esophagitis: Secondary | ICD-10-CM | POA: Diagnosis not present

## 2020-04-24 DIAGNOSIS — R110 Nausea: Secondary | ICD-10-CM | POA: Diagnosis not present

## 2020-04-24 DIAGNOSIS — R1084 Generalized abdominal pain: Secondary | ICD-10-CM | POA: Diagnosis not present

## 2020-04-28 ENCOUNTER — Encounter: Payer: Self-pay | Admitting: Cardiology

## 2020-04-28 ENCOUNTER — Telehealth (INDEPENDENT_AMBULATORY_CARE_PROVIDER_SITE_OTHER): Payer: Medicare HMO | Admitting: Cardiology

## 2020-04-28 VITALS — Ht 70.5 in

## 2020-04-28 DIAGNOSIS — R0789 Other chest pain: Secondary | ICD-10-CM | POA: Diagnosis not present

## 2020-04-28 DIAGNOSIS — G4733 Obstructive sleep apnea (adult) (pediatric): Secondary | ICD-10-CM | POA: Diagnosis not present

## 2020-04-28 DIAGNOSIS — I10 Essential (primary) hypertension: Secondary | ICD-10-CM

## 2020-04-28 DIAGNOSIS — K219 Gastro-esophageal reflux disease without esophagitis: Secondary | ICD-10-CM | POA: Diagnosis not present

## 2020-04-28 NOTE — Progress Notes (Signed)
Virtual Visit via Video Note   This visit type was conducted due to national recommendations for restrictions regarding the COVID-19 Pandemic (e.g. social distancing) in an effort to limit this patient's exposure and mitigate transmission in our community.  Due to his co-morbid illnesses, this patient is at least at moderate risk for complications without adequate follow up.  This format is felt to be most appropriate for this patient at this time.  All issues noted in this document were discussed and addressed.  A limited physical exam was performed with this format.  Please refer to the patient's chart for his consent to telehealth for [facility]   The patient was identified using 2 identifiers.  Date:  04/28/2020   ID:  Ronald Peters, DOB 04/27/1951, MRN [id number]  Patient Location: Home Provider Location: Home  PCP:  Ronald Fess, MD  Cardiologist:  Dr Oval Linsey Electrophysiologist:  None   Evaluation Performed:  Follow-Up Visit  Chief Complaint:  none  History of Present Illness:    Ronald Peters is a 69 y.o. male with a history of hypertension and sleep apnea.  In 2018 he had a treadmill test for chest pain that was negative for ischemia.  He was contacted today for a routine 74-month follow-up.  Since he saw Dr. Oval Linsey last he has been doing well.  He did recently move to the [place] area.  This was quite strenuous and he actually lost several pounds.  He has noticed his blood pressures have been slightly elevated with a systolic of [id number].  He attributes this to the stress of the move.  I suggested he monitor his pressure 2-3 times a week and let us know if it consistently runs greater than 135/85 as we will need to make an adjustment in his medications.  He has also had a GI work-up and has been diagnosed with GERD and Barrett's esophagus.  His symptoms improved with the addition of a PPI. This was by Dr. Glennon Peters at [facility]. Dr Ronald Peters follows his labs.   The patient does not  have symptoms concerning for COVID-19 infection (fever, chills, cough, or new shortness of breath). He and his wife have been vaccinated.    Past Medical History:  Diagnosis Date  . Atypical chest pain 04/17/2017  . Chest pain   . Essential hypertension 04/17/2017  . Hypercholesteremia   . Hypertension   . Pre-diabetes   . Sleep apnea    Past Surgical History:  Procedure Laterality Date  . COLONOSCOPY W/ BIOPSIES AND POLYPECTOMY    . VASECTOMY    . WISDOM TOOTH EXTRACTION       Current Meds  Medication Sig  . aspirin EC 81 MG tablet Take 81 mg by mouth daily.  . hydrochlorothiazide (HYDRODIURIL) 25 MG tablet Take 1 tablet (25 mg total) by mouth daily.  Marland Kitchen loratadine (CLARITIN) 10 MG tablet Take 10 mg by mouth daily.  . Multiple Vitamins-Minerals (MULTIVITAMIN ADULT PO) Take by mouth.  . Omega-3 Fatty Acids (FISH OIL PO) Take 1 tablet by mouth daily.  Marland Kitchen omeprazole (PRILOSEC) 40 MG capsule Take 40 mg by mouth daily.  . pravastatin (PRAVACHOL) 10 MG tablet Take 10 mg by mouth daily.  . PSYLLIUM HUSK PO Take 12 g by mouth daily.     Allergies:   Simvastatin   Social History   Tobacco Use  . Smoking status: Former Smoker    Packs/day: 2.00    Years: 25.00    Pack years: 50.00  Types: Cigarettes    Quit date: 12/19/1993    Years since quitting: 26.3  . Smokeless tobacco: Never Used  Substance Use Topics  . Alcohol use: Yes    Comment: socially  . Drug use: No     Family Hx: The patient's family history includes Atrial fibrillation in his mother; Cancer in his paternal aunt; Colon cancer in his maternal grandmother; Heart disease in his father; Heart failure in his father; Hypertension in his mother; Stroke in his father and mother.  ROS:   Please see the history of present illness.    DJD knees All other systems reviewed and are negative.   Prior CV studies:   The following studies were reviewed today: GXT 2017-06-08  Labs/Other Tests and Data Reviewed:    EKG:   An ECG dated 03/09/2017 was personally reviewed today and demonstrated:  NSR-HR 62  Recent Labs: No results found for requested labs within last 8760 hours.   Recent Lipid Panel No results found for: CHOL, TRIG, HDL, CHOLHDL, LDLCALC, LDLDIRECT  Wt Readings from Last 3 Encounters:  10/29/19 281 lb (127.5 kg)  07/05/17 282 lb (127.9 kg)  05/15/17 283 lb 2 oz (128.4 kg)     Objective:    Vital Signs:  Ht 5' 10.5" (1.791 m)   BMI 39.75 kg/m    VITAL SIGNS:  reviewed- Home B/P 142/80  ASSESSMENT & PLAN:    HTN- He will monitor this and let us know if he is trending > 135/85.  OSA- On C-pap  GERD- Barret's esophagus, Dr Ronald Peters at [facility] follows. He is now on PPI.  COVID-19 Education: The signs and symptoms of COVID-19 were discussed with the patient and how to seek care for testing (follow up with PCP or arrange E-visit).  The importance of social distancing was discussed today.  Time:   Today, I have spent 15 minutes with the patient with telehealth technology discussing the above problems.     Medication Adjustments/Labs and Tests Ordered: Current medicines are reviewed at length with the patient today.  Concerns regarding medicines are outlined above.   Tests Ordered: No orders of the defined types were placed in this encounter.   Medication Changes: No orders of the defined types were placed in this encounter.   Follow Up:  In Person with Dr Oval Linsey.  He will need an updated EKG.   Signed, Ronald Ransom, PA-C  04/28/2020 8:22 AM    [facility]

## 2020-04-28 NOTE — Patient Instructions (Signed)
Medication Instructions:  Your physician recommends that you continue on your current medications as directed. Please refer to the Current Medication list given to you today.  *If you need a refill on your cardiac medications before your next appointment, please call your pharmacy*    Follow-Up: At [facility], you and your health needs are our priority.  As part of our continuing mission to provide you with exceptional heart care, we have created designated Provider Care Teams.  These Care Teams include your primary Cardiologist (physician) and Advanced Practice Providers (APPs -  Physician Assistants and Nurse Practitioners) who all work together to provide you with the care you need, when you need it.  We recommend signing up for the patient portal called "MyChart".  Sign up information is provided on this After Visit Summary.  MyChart is used to connect with patients for Virtual Visits (Telemedicine).  Patients are able to view lab/test results, encounter notes, upcoming appointments, etc.  Non-urgent messages can be sent to your provider as well.   To learn more about what you can do with MyChart, go to [email or web address].    Your next appointment:   6 month(s)  The format for your next appointment:   In Person  Provider:   Skeet Latch, MD   Other Instructions Your physician has requested that you regularly monitor and record your blood pressure readings at home. Please use the same machine at the same time of day to check your readings and record them. Please call our office at [phone] with you blood pressure readings in 2-3 weeks.  Please call our offic e 2 months in advance to schedule your follow-up appointment with Dr. Oval Linsey.

## 2020-08-04 ENCOUNTER — Other Ambulatory Visit: Payer: Self-pay

## 2020-08-04 MED ORDER — HYDROCHLOROTHIAZIDE 25 MG PO TABS: 25.0000 mg | ORAL_TABLET | Freq: Every day | ORAL | 2 refills | Status: DC

## 2020-08-17 DIAGNOSIS — U071 COVID-19: Secondary | ICD-10-CM | POA: Diagnosis not present

## 2020-08-17 DIAGNOSIS — Z20828 Contact with and (suspected) exposure to other viral communicable diseases: Secondary | ICD-10-CM | POA: Diagnosis not present

## 2020-08-27 DIAGNOSIS — R69 Illness, unspecified: Secondary | ICD-10-CM | POA: Diagnosis not present

## 2020-10-01 DIAGNOSIS — R69 Illness, unspecified: Secondary | ICD-10-CM | POA: Diagnosis not present

## 2020-10-08 DIAGNOSIS — E782 Mixed hyperlipidemia: Secondary | ICD-10-CM | POA: Diagnosis not present

## 2020-10-08 DIAGNOSIS — G4730 Sleep apnea, unspecified: Secondary | ICD-10-CM | POA: Diagnosis not present

## 2020-10-08 DIAGNOSIS — Z23 Encounter for immunization: Secondary | ICD-10-CM | POA: Diagnosis not present

## 2020-10-08 DIAGNOSIS — I10 Essential (primary) hypertension: Secondary | ICD-10-CM | POA: Diagnosis not present

## 2020-10-08 DIAGNOSIS — R7301 Impaired fasting glucose: Secondary | ICD-10-CM | POA: Diagnosis not present

## 2020-10-08 DIAGNOSIS — K760 Fatty (change of) liver, not elsewhere classified: Secondary | ICD-10-CM | POA: Diagnosis not present

## 2020-10-08 DIAGNOSIS — Z0000 Encounter for general adult medical examination without abnormal findings: Secondary | ICD-10-CM | POA: Diagnosis not present

## 2020-10-08 DIAGNOSIS — Z86010 Personal history of colonic polyps: Secondary | ICD-10-CM | POA: Diagnosis not present

## 2020-10-08 DIAGNOSIS — Z125 Encounter for screening for malignant neoplasm of prostate: Secondary | ICD-10-CM | POA: Diagnosis not present

## 2020-11-05 DIAGNOSIS — K2270 Barrett's esophagus without dysplasia: Secondary | ICD-10-CM | POA: Diagnosis not present

## 2020-11-05 DIAGNOSIS — Z86010 Personal history of colonic polyps: Secondary | ICD-10-CM | POA: Diagnosis not present

## 2020-11-05 DIAGNOSIS — K219 Gastro-esophageal reflux disease without esophagitis: Secondary | ICD-10-CM | POA: Diagnosis not present

## 2020-11-05 DIAGNOSIS — R109 Unspecified abdominal pain: Secondary | ICD-10-CM | POA: Diagnosis not present

## 2020-11-24 DIAGNOSIS — R69 Illness, unspecified: Secondary | ICD-10-CM | POA: Diagnosis not present

## 2021-01-07 ENCOUNTER — Ambulatory Visit: Payer: Medicare HMO | Admitting: Dermatology

## 2021-01-07 ENCOUNTER — Other Ambulatory Visit: Payer: Self-pay

## 2021-01-07 DIAGNOSIS — L570 Actinic keratosis: Secondary | ICD-10-CM | POA: Diagnosis not present

## 2021-01-07 DIAGNOSIS — L720 Epidermal cyst: Secondary | ICD-10-CM | POA: Diagnosis not present

## 2021-01-07 DIAGNOSIS — D1800 Hemangioma unspecified site: Secondary | ICD-10-CM | POA: Diagnosis not present

## 2021-01-07 DIAGNOSIS — D229 Melanocytic nevi, unspecified: Secondary | ICD-10-CM | POA: Diagnosis not present

## 2021-01-07 DIAGNOSIS — L578 Other skin changes due to chronic exposure to nonionizing radiation: Secondary | ICD-10-CM

## 2021-01-07 DIAGNOSIS — L814 Other melanin hyperpigmentation: Secondary | ICD-10-CM

## 2021-01-07 DIAGNOSIS — L918 Other hypertrophic disorders of the skin: Secondary | ICD-10-CM

## 2021-01-07 DIAGNOSIS — L304 Erythema intertrigo: Secondary | ICD-10-CM

## 2021-01-07 DIAGNOSIS — L821 Other seborrheic keratosis: Secondary | ICD-10-CM | POA: Diagnosis not present

## 2021-01-07 DIAGNOSIS — Z1283 Encounter for screening for malignant neoplasm of skin: Secondary | ICD-10-CM | POA: Diagnosis not present

## 2021-01-07 DIAGNOSIS — L820 Inflamed seborrheic keratosis: Secondary | ICD-10-CM

## 2021-01-07 MED ORDER — KETOCONAZOLE 2 % EX CREA: TOPICAL_CREAM | CUTANEOUS | 3 refills | Status: DC

## 2021-01-07 NOTE — Progress Notes (Unsigned)
New Patient Visit  Subjective  Ronald Peters is a 70 y.o. male who presents for the following: Annual Exam (Hx AK's - lesion on the R cheek and crown/scalp that patient would like checked.) and Rash (Inguinal creases - burns and stings patient would like to discuss treatment options). The patient presents for Total-Body Skin Exam (TBSE) for skin cancer screening and mole check.  The following portions of the chart were reviewed this encounter and updated as appropriate:   Tobacco  Allergies  Meds  Problems  Med Hx  Surg Hx  Fam Hx     Review of Systems:  No other skin or systemic complaints except as noted in HPI or Assessment and Plan.  Objective  Well appearing patient in no apparent distress; mood and affect are within normal limits.  A full examination was performed including scalp, head, eyes, ears, nose, lips, neck, chest, axillae, abdomen, back, buttocks, bilateral upper extremities, bilateral lower extremities, hands, feet, fingers, toes, fingernails, and toenails. All findings within normal limits unless otherwise noted below.  Objective  Face x 2, scalp x 8 (10): Erythematous thin papules/macules with gritty scale.   Objective  Face x 2 (2): Erythematous keratotic or waxy stuck-on papule or plaque.   Objective  Mid to upper back: 1.0 cm firm SQ nodule   Objective  Groin: Pinkness  Assessment & Plan  AK (actinic keratosis) (10) Face x 2, scalp x 8  Destruction of lesion - Face x 2, scalp x 8 Complexity: simple   Destruction method: cryotherapy   Informed consent: discussed and consent obtained   Timeout:  patient name, date of birth, surgical site, and procedure verified Lesion destroyed using liquid nitrogen: Yes   Region frozen until ice ball extended beyond lesion: Yes   Outcome: patient tolerated procedure well with no complications   Post-procedure details: wound care instructions given    Inflamed seborrheic keratosis Face x 2  Destruction of  lesion - Face x 2 Complexity: simple   Destruction method: cryotherapy   Informed consent: discussed and consent obtained   Timeout:  patient name, date of birth, surgical site, and procedure verified Lesion destroyed using liquid nitrogen: Yes   Region frozen until ice ball extended beyond lesion: Yes   Outcome: patient tolerated procedure well with no complications   Post-procedure details: wound care instructions given    Epidermal inclusion cyst Mid to upper back Benign appearing, observe.  Erythema intertrigo Groin Chronic, persistent, recurrent -  Intertrigo is a chronic recurrent rash that occurs in skin fold areas that may be associated with friction; heat; moisture; yeast; fungus; and bacteria.  It is exacerbated by increased movement / activity; sweating; and higher atmospheric temperature.  Start Ketoconazole 2% cream QHS.   ketoconazole (NIZORAL) 2 % cream - Groin  Skin cancer screening   Lentigines - Scattered tan macules - Discussed due to sun exposure - Benign, observe - Call for any changes  Seborrheic Keratoses - Stuck-on, waxy, tan-brown papules and plaques  - Discussed benign etiology and prognosis. - Observe - Call for any changes  Melanocytic Nevi - Tan-brown and/or pink-flesh-colored symmetric macules and papules - Benign appearing on exam today - Observation - Call clinic for new or changing moles - Recommend daily use of broad spectrum spf 30+ sunscreen to sun-exposed areas.   Hemangiomas - Red papules - Discussed benign nature - Observe - Call for any changes  Actinic Damage - Chronic, secondary to cumulative UV/sun exposure - diffuse scaly erythematous macules with  underlying dyspigmentation - Recommend daily broad spectrum sunscreen SPF 30+ to sun-exposed areas, reapply every 2 hours as needed.  - Call for new or changing lesions.  Severe, Confluent Chronic Actinic Changes with Pre-Cancerous Actinic Keratoses due to cumulative sun  exposure/UV radiation exposure over time - Discussed Prescription "Field Treatment" Field treatment involves treatment of an entire area of skin that has confluent Actinic Changes (Sun/ Ultraviolet light damage) and PreCancerous Actinic Keratoses by method of PhotoDynamic Therapy (PDT) and/or prescription Topical Chemotherapy agents such as 5-fluorouracil, 5-fluorouracil/calcipotriene, and/or imiquimod.  The purpose is to decrease the number of clinically evident and subclinical PreCancerous lesions to prevent progression to development of skin cancer by chemically destroying early precancer changes that may or may not be visible.  It has been shown to reduce the risk of developing skin cancer in the treated area. As a result of treatment, redness, scaling, crusting, and open sores may occur during treatment course. One or more than one of these methods may be used and may have to be used several times to control, suppress and eliminate the PreCancerous changes. Discussed treatment course, expected reaction, and possible side effects.  Acrochordons (Skin Tags) - Fleshy, skin-colored pedunculated papules - Benign appearing.  - Observe. - If desired, they can be removed with an in office procedure that is not covered by insurance. - Please call the clinic if you notice any new or changing lesions.  Skin cancer screening performed today.  Return in about 2 months (around 03/07/2021) for AK follow up .  Luther Redo, CMA, am acting as scribe for Sarina Ser, MD .  Documentation: I have reviewed the above documentation for accuracy and completeness, and I agree with the above.  Sarina Ser, MD

## 2021-01-10 ENCOUNTER — Encounter: Payer: Self-pay | Admitting: Dermatology

## 2021-01-14 DIAGNOSIS — K621 Rectal polyp: Secondary | ICD-10-CM | POA: Diagnosis not present

## 2021-01-14 DIAGNOSIS — K635 Polyp of colon: Secondary | ICD-10-CM | POA: Diagnosis not present

## 2021-01-14 DIAGNOSIS — K2289 Other specified disease of esophagus: Secondary | ICD-10-CM | POA: Diagnosis not present

## 2021-01-14 DIAGNOSIS — K5730 Diverticulosis of large intestine without perforation or abscess without bleeding: Secondary | ICD-10-CM | POA: Diagnosis not present

## 2021-01-14 DIAGNOSIS — K2100 Gastro-esophageal reflux disease with esophagitis, without bleeding: Secondary | ICD-10-CM | POA: Diagnosis not present

## 2021-01-14 DIAGNOSIS — K2270 Barrett's esophagus without dysplasia: Secondary | ICD-10-CM | POA: Diagnosis not present

## 2021-01-14 DIAGNOSIS — Z86010 Personal history of colonic polyps: Secondary | ICD-10-CM | POA: Diagnosis not present

## 2021-02-03 DIAGNOSIS — H40003 Preglaucoma, unspecified, bilateral: Secondary | ICD-10-CM | POA: Diagnosis not present

## 2021-03-08 DIAGNOSIS — H40003 Preglaucoma, unspecified, bilateral: Secondary | ICD-10-CM | POA: Diagnosis not present

## 2021-03-17 ENCOUNTER — Encounter: Payer: Self-pay | Admitting: Dermatology

## 2021-03-17 ENCOUNTER — Ambulatory Visit: Payer: Medicare HMO | Admitting: Dermatology

## 2021-03-17 ENCOUNTER — Other Ambulatory Visit: Payer: Self-pay

## 2021-03-17 ENCOUNTER — Other Ambulatory Visit: Payer: Self-pay | Admitting: Cardiovascular Disease

## 2021-03-17 DIAGNOSIS — L578 Other skin changes due to chronic exposure to nonionizing radiation: Secondary | ICD-10-CM | POA: Diagnosis not present

## 2021-03-17 DIAGNOSIS — L821 Other seborrheic keratosis: Secondary | ICD-10-CM

## 2021-03-17 DIAGNOSIS — L570 Actinic keratosis: Secondary | ICD-10-CM

## 2021-03-17 NOTE — Progress Notes (Signed)
   Follow-Up Visit   Subjective  Ronald Peters is a 70 y.o. male who presents for the following: Actinic Keratosis (Face, scalp, 48m f/u).  The following portions of the chart were reviewed this encounter and updated as appropriate:   Tobacco  Allergies  Meds  Problems  Med Hx  Surg Hx  Fam Hx     Review of Systems:  No other skin or systemic complaints except as noted in HPI or Assessment and Plan.  Objective  Well appearing patient in no apparent distress; mood and affect are within normal limits.  A focused examination was performed including face, scalp. Relevant physical exam findings are noted in the Assessment and Plan.  Objective  scalp x 1: Pink scaly macules    Assessment & Plan    Actinic Damage - chronic, secondary to cumulative UV radiation exposure/sun exposure over time - diffuse scaly erythematous macules with underlying dyspigmentation - Recommend daily broad spectrum sunscreen SPF 30+ to sun-exposed areas, reapply every 2 hours as needed.  - Recommend staying in the shade or wearing long sleeves, sun glasses (UVA+UVB protection) and wide brim hats (4-inch brim around the entire circumference of the hat). - Call for new or changing lesions.  AK (actinic keratosis) scalp x 1  Recommend daily broad spectrum sunscreen SPF 30+ to sun-exposed areas, reapply every 2 hours as needed. Call for new or changing lesions.  Staying in the shade or wearing long sleeves, sun glasses (UVA+UVB protection) and wide brim hats (4-inch brim around the entire circumference of the hat) are also recommended for sun protection.    Destruction of lesion - scalp x 1 Complexity: simple   Destruction method: cryotherapy   Informed consent: discussed and consent obtained   Timeout:  patient name, date of birth, surgical site, and procedure verified Lesion destroyed using liquid nitrogen: Yes   Region frozen until ice ball extended beyond lesion: Yes   Outcome: patient tolerated  procedure well with no complications   Post-procedure details: wound care instructions given     Seborrheic Keratoses - Stuck-on, waxy, tan-brown papules and/or plaques  - Benign-appearing - Discussed benign etiology and prognosis. - Observe - Call for any changes  Return in about 1 year (around 03/17/2022) for TBSE, Hx of AKs.   I, Othelia Pulling, RMA, am acting as scribe for Sarina Ser, MD .  Documentation: I have reviewed the above documentation for accuracy and completeness, and I agree with the above.  Sarina Ser, MD

## 2021-03-17 NOTE — Patient Instructions (Signed)

## 2021-03-18 ENCOUNTER — Telehealth: Payer: Self-pay | Admitting: Cardiovascular Disease

## 2021-03-18 ENCOUNTER — Encounter: Payer: Self-pay | Admitting: Dermatology

## 2021-03-18 MED ORDER — HYDROCHLOROTHIAZIDE 25 MG PO TABS: 25.0000 mg | ORAL_TABLET | Freq: Every day | ORAL | 3 refills | Status: DC

## 2021-03-18 NOTE — Telephone Encounter (Signed)
 *  STAT* If patient is at the pharmacy, call can be transferred to refill team.   1. Which medications need to be refilled? (please list name of each medication and dose if known)   hydrochlorothiazide (HYDRODIURIL) 25 MG tablet  2. Which pharmacy/location (including street and city if local pharmacy) is medication to be sent to?  [facility], [place] AT [facility] to Registered [facility] [facility]   3. Do they need a 30 day or 90 day supply? 90   Patient states that this prescription was sent to the wrong pharmacy. Please resend to [facility].

## 2022-03-23 ENCOUNTER — Ambulatory Visit: Payer: Medicare HMO | Admitting: Dermatology

## 2022-03-23 DIAGNOSIS — D492 Neoplasm of unspecified behavior of bone, soft tissue, and skin: Secondary | ICD-10-CM

## 2022-03-23 DIAGNOSIS — L821 Other seborrheic keratosis: Secondary | ICD-10-CM | POA: Diagnosis not present

## 2022-03-23 DIAGNOSIS — L570 Actinic keratosis: Secondary | ICD-10-CM

## 2022-03-23 DIAGNOSIS — L814 Other melanin hyperpigmentation: Secondary | ICD-10-CM

## 2022-03-23 DIAGNOSIS — D229 Melanocytic nevi, unspecified: Secondary | ICD-10-CM

## 2022-03-23 DIAGNOSIS — Z1283 Encounter for screening for malignant neoplasm of skin: Secondary | ICD-10-CM

## 2022-03-23 DIAGNOSIS — L280 Lichen simplex chronicus: Secondary | ICD-10-CM

## 2022-03-23 DIAGNOSIS — D489 Neoplasm of uncertain behavior, unspecified: Secondary | ICD-10-CM

## 2022-03-23 DIAGNOSIS — L720 Epidermal cyst: Secondary | ICD-10-CM | POA: Diagnosis not present

## 2022-03-23 DIAGNOSIS — L578 Other skin changes due to chronic exposure to nonionizing radiation: Secondary | ICD-10-CM

## 2022-03-23 DIAGNOSIS — D1800 Hemangioma unspecified site: Secondary | ICD-10-CM

## 2022-03-23 NOTE — Patient Instructions (Signed)
Cryotherapy Aftercare  Wash gently with soap and water everyday.   Apply Vaseline and Band-Aid daily until healed.    Wound Care Instructions  Cleanse wound gently with soap and water once a day then pat dry with clean gauze. Apply a thing coat of Petrolatum (petroleum jelly, "Vaseline") over the wound (unless you have an allergy to this). We recommend that you use a new, sterile tube of Vaseline. Do not pick or remove scabs. Do not remove the yellow or white "healing tissue" from the base of the wound.  Cover the wound with fresh, clean, nonstick gauze and secure with paper tape. You may use Band-Aids in place of gauze and tape if the would is small enough, but would recommend trimming much of the tape off as there is often too much. Sometimes Band-Aids can irritate the skin.  You should call the office for your biopsy report after 1 week if you have not already been contacted.  If you experience any problems, such as abnormal amounts of bleeding, swelling, significant bruising, significant pain, or evidence of infection, please call the office immediately.  FOR ADULT SURGERY PATIENTS: If you need something for pain relief you may take 1 extra strength Tylenol (acetaminophen) AND 2 Ibuprofen (200mg each) together every 4 hours as needed for pain. (do not take these if you are allergic to them or if you have a reason you should not take them.) Typically, you may only need pain medication for 1 to 3 days.        If You Need Anything After Your Visit  If you have any questions or concerns for your doctor, please call our main line at [phone] and press option 4 to reach your doctor's medical assistant. If no one answers, please leave a voicemail as directed and we will return your call as soon as possible. Messages left after 4 pm will be answered the following business day.   You may also send us a message via MyChart. We typically respond to MyChart messages within 1-2 business  days.  For prescription refills, please ask your pharmacy to contact our office. Our fax number is [phone].  If you have an urgent issue when the clinic is closed that cannot wait until the next business day, you can page your doctor at the number below.    Please note that while we do our best to be available for urgent issues outside of office hours, we are not available 24/7.   If you have an urgent issue and are unable to reach us, you may choose to seek medical care at your doctor's office, retail clinic, urgent care center, or emergency room.  If you have a medical emergency, please immediately call 911 or go to the emergency department.  Pager Numbers  - Dr. Kowalski: [phone]  - Dr. Moye: [phone]  - Dr. Stewart: [phone]  In the event of inclement weather, please call our main line at [phone] for an update on the status of any delays or closures.  Dermatology Medication Tips: Please keep the boxes that topical medications come in in order to help keep track of the instructions about where and how to use these. Pharmacies typically print the medication instructions only on the boxes and not directly on the medication tubes.   If your medication is too expensive, please contact our office at [phone] option 4 or send us a message through MyChart.   We are unable to tell what your co-pay for medications will be   in advance as this is different depending on your insurance coverage. However, we may be able to find a substitute medication at lower cost or fill out paperwork to get insurance to cover a needed medication.   If a prior authorization is required to get your medication covered by your insurance company, please allow us 1-2 business days to complete this process.  Drug prices often vary depending on where the prescription is filled and some pharmacies may offer cheaper prices.  The website www.goodrx.com contains coupons for medications through  different pharmacies. The prices here do not account for what the cost may be with help from insurance (it may be cheaper with your insurance), but the website can give you the price if you did not use any insurance.  - You can print the associated coupon and take it with your prescription to the pharmacy.  - You may also stop by our office during regular business hours and pick up a GoodRx coupon card.  - If you need your prescription sent electronically to a different pharmacy, notify our office through [facility] MyChart or by phone at [phone] option 4.     Si Usted Necesita Algo Despus de Su Visita  Tambin puede enviarnos un mensaje a travs de MyChart. Por lo general respondemos a los mensajes de MyChart en el transcurso de 1 a 2 das hbiles.  Para renovar recetas, por favor pida a su farmacia que se ponga en contacto con nuestra oficina. Nuestro nmero de fax es el [phone].  Si tiene un asunto urgente cuando la clnica est cerrada y que no puede esperar hasta el siguiente da hbil, puede llamar/localizar a su doctor(a) al nmero que aparece a continuacin.   Por favor, tenga en cuenta que aunque hacemos todo lo posible para estar disponibles para asuntos urgentes fuera del horario de oficina, no estamos disponibles las 24 horas del da, los 7 das de la semana.   Si tiene un problema urgente y no puede comunicarse con nosotros, puede optar por buscar atencin mdica  en el consultorio de su doctor(a), en una clnica privada, en un centro de atencin urgente o en una sala de emergencias.  Si tiene una emergencia mdica, por favor llame inmediatamente al 911 o vaya a la sala de emergencias.  Nmeros de bper  - Dr. Kowalski: [phone]  - Dra. Moye: [phone]  - Dra. Stewart: [phone]  En caso de inclemencias del tiempo, por favor llame a nuestra lnea principal al [phone] para una actualizacin sobre el estado de cualquier retraso o cierre.  Consejos  para la medicacin en dermatologa: Por favor, guarde las cajas en las que vienen los medicamentos de uso tpico para ayudarle a seguir las instrucciones sobre dnde y cmo usarlos. Las farmacias generalmente imprimen las instrucciones del medicamento slo en las cajas y no directamente en los tubos del medicamento.   Si su medicamento es muy caro, por favor, pngase en contacto con nuestra oficina llamando al [phone] y presione la opcin 4 o envenos un mensaje a travs de MyChart.   No podemos decirle cul ser su copago por los medicamentos por adelantado ya que esto es diferente dependiendo de la cobertura de su seguro. Sin embargo, es posible que podamos encontrar un medicamento sustituto a menor costo o llenar un formulario para que el seguro cubra el medicamento que se considera necesario.   Si se requiere una autorizacin previa para que su compaa de seguros cubra su medicamento, por favor permtanos de 1 a   2 das hbiles para completar este proceso.  Los precios de los medicamentos varan con frecuencia dependiendo del lugar de dnde se surte la receta y alguna farmacias pueden ofrecer precios ms baratos.  El sitio web www.goodrx.com tiene cupones para medicamentos de diferentes farmacias. Los precios aqu no tienen en cuenta lo que podra costar con la ayuda del seguro (puede ser ms barato con su seguro), pero el sitio web puede darle el precio si no utiliz ningn seguro.  - Puede imprimir el cupn correspondiente y llevarlo con su receta a la farmacia.  - Tambin puede pasar por nuestra oficina durante el horario de atencin regular y recoger una tarjeta de cupones de GoodRx.  - Si necesita que su receta se enve electrnicamente a una farmacia diferente, informe a nuestra oficina a travs de MyChart de [facility] o por telfono llamando al [phone] y presione la opcin 4.  

## 2022-03-23 NOTE — Progress Notes (Signed)
? ?Follow-Up Visit ?  ?Subjective  ?Ronald Peters is a 71 y.o. male who presents for the following: Annual Exam (History of Aks - TBSE today). ?The patient presents for Total-Body Skin Exam (TBSE) for skin cancer screening and mole check.  The patient has spots, moles and lesions to be evaluated, some may be new or changing and the patient has concerns that these could be cancer. ? ?The following portions of the chart were reviewed this encounter and updated as appropriate:  ? Tobacco  Allergies  Meds  Problems  Med Hx  Surg Hx  Fam Hx   ?  ?Review of Systems:  No other skin or systemic complaints except as noted in HPI or Assessment and Plan. ? ?Objective  ?Well appearing patient in no apparent distress; mood and affect are within normal limits. ? ?A full examination was performed including scalp, head, eyes, ears, nose, lips, neck, chest, axillae, abdomen, back, buttocks, bilateral upper extremities, bilateral lower extremities, hands, feet, fingers, toes, fingernails, and toenails. All findings within normal limits unless otherwise noted below. ? ?Face, scalp (5) ?Erythematous thin papules/macules with gritty scale.  ? ?Left Thigh - Anterior ?Hyperkeratotic papule ? ?Right Axilla ?Flesh papule 0.6 cm ? ? ?Assessment & Plan  ? ?Lentigines ?- Scattered tan macules ?- Due to sun exposure ?- Benign-appearing, observe ?- Recommend daily broad spectrum sunscreen SPF 30+ to sun-exposed areas, reapply every 2 hours as needed. ?- Call for any changes ? ?Seborrheic Keratoses ?- Stuck-on, waxy, tan-brown papules and/or plaques  ?- Benign-appearing ?- Discussed benign etiology and prognosis. ?- Observe ?- Call for any changes ? ?Melanocytic Nevi ?- Tan-brown and/or pink-flesh-colored symmetric macules and papules ?- Benign appearing on exam today ?- Observation ?- Call clinic for new or changing moles ?- Recommend daily use of broad spectrum spf 30+ sunscreen to sun-exposed areas.  ? ?Hemangiomas ?- Red papules ?-  Discussed benign nature ?- Observe ?- Call for any changes ? ?Actinic Damage ?- Chronic condition, secondary to cumulative UV/sun exposure ?- diffuse scaly erythematous macules with underlying dyspigmentation ?- Recommend daily broad spectrum sunscreen SPF 30+ to sun-exposed areas, reapply every 2 hours as needed.  ?- Staying in the shade or wearing long sleeves, sun glasses (UVA+UVB protection) and wide brim hats (4-inch brim around the entire circumference of the hat) are also recommended for sun protection.  ?- Call for new or changing lesions. ? ?Skin cancer screening performed today. ? ?AK (actinic keratosis) (5) ?Face, scalp ?Destruction of lesion - Face, scalp ?Complexity: simple   ?Destruction method: cryotherapy   ?Informed consent: discussed and consent obtained   ?Timeout:  patient name, date of birth, surgical site, and procedure verified ?Lesion destroyed using liquid nitrogen: Yes   ?Region frozen until ice ball extended beyond lesion: Yes   ?Outcome: patient tolerated procedure well with no complications   ?Post-procedure details: wound care instructions given   ? 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 ?Neoplasm of uncertain behavior ?Right Axilla ?Skin excision ? ?Total excision diameter (cm):  0.6 ?Informed consent: discussed and consent obtained   ?Timeout: patient name, date of birth, surgical site, and procedure verified   ?Procedure prep:  Patient was prepped and draped in usual sterile fashion ?Prep type:  Isopropyl alcohol and povidone-iodine ?Anesthesia: the lesion was anesthetized in a standard fashion   ?Anesthetic:  1% lidocaine w/ epinephrine 1-100,000 buffered w/ 8.4% NaHCO3 ?Instrument used: #15 blade   ?Hemostasis achieved with:  pressure and electrodesiccation   ?Outcome: patient tolerated procedure well with no complications   ?Post-procedure details: sterile dressing applied and wound care instructions given    ?Dressing type: bandage and pressure dressing (mupirocin)   ? ?Specimen 1 - Surgical pathology ?Differential Diagnosis: Cyst vs other  ?Check Margins: No ? ?Skin cancer screening ? ?Return in about 1 year (around 03/24/2023) for TBSE. ? ?I, Ashok Cordia, CMA, am acting as scribe for Sarina Ser, MD . ?Documentation: I have reviewed the above documentation for accuracy and completeness, and I agree with the above. ? ?Sarina Ser, MD ? ?

## 2022-03-24 ENCOUNTER — Encounter: Payer: Self-pay | Admitting: Dermatology

## 2022-03-30 ENCOUNTER — Telehealth: Payer: Self-pay

## 2022-03-30 NOTE — Telephone Encounter (Signed)
Advised patient of results/hd  

## 2022-03-30 NOTE — Telephone Encounter (Signed)
-----   Message from Ralene Bathe, MD sent at 03/24/2022  6:02 PM EDT ----- ?Diagnosis ?Skin , right axilla ?EPIDERMAL INCLUSION CYST ? ?Benign cyst ?

## 2022-03-30 NOTE — Telephone Encounter (Signed)
Line picked up but no one answered.  ?

## 2022-05-16 ENCOUNTER — Other Ambulatory Visit: Payer: Self-pay | Admitting: Cardiovascular Disease

## 2022-06-23 ENCOUNTER — Ambulatory Visit (HOSPITAL_BASED_OUTPATIENT_CLINIC_OR_DEPARTMENT_OTHER): Payer: Medicare HMO | Admitting: Family

## 2022-06-23 VITALS — BP 128/82 | HR 58 | Ht 70.5 in | Wt 247.2 lb

## 2022-06-23 DIAGNOSIS — E782 Mixed hyperlipidemia: Secondary | ICD-10-CM | POA: Diagnosis not present

## 2022-06-23 DIAGNOSIS — I10 Essential (primary) hypertension: Secondary | ICD-10-CM

## 2022-06-23 DIAGNOSIS — G4733 Obstructive sleep apnea (adult) (pediatric): Secondary | ICD-10-CM

## 2022-06-23 MED ORDER — HYDROCHLOROTHIAZIDE 25 MG PO TABS: 25.0000 mg | ORAL_TABLET | Freq: Every day | ORAL | 3 refills | Status: DC

## 2022-06-23 NOTE — Progress Notes (Signed)
Office Visit    Patient Name: Ronald Peters Date of Encounter: 06/25/2022  PCP:  [place], [place]  Cardiologist:  Skeet Latch, MD  Advanced Practice Provider:  No care team member to display Electrophysiologist:  None      Chief Complaint    Ronald Peters is a 71 y.o. male  presents today for hypertension follow up   Past Medical History    Past Medical History:  Diagnosis Date   Actinic keratosis    Atypical chest pain 04/17/2017   Chest pain    Essential hypertension 04/17/2017   Hypercholesteremia    Hypertension    Pre-diabetes    Sleep apnea    Past Surgical History:  Procedure Laterality Date   COLONOSCOPY W/ BIOPSIES AND POLYPECTOMY     VASECTOMY     WISDOM TOOTH EXTRACTION      Allergies  Allergies  Allergen Reactions   Simvastatin Other (See Comments)    elevated liver enzymes     History of Present Illness    Ronald Peters is a 71 y.o. male with a hx of hypertension, ERD, sleep apnea last seen 04/28/20.  Prior cardiac testing includes treadmill test 2018 negative for ischemia. Last seen via video visit 04/2020 doing well from cardiac perspective.   He presents today for follow up independently. He and his wife moved from [place] to [place] and enjoy the area. They enjoy walking at different parks together for exercise and have been trying to e more active. Reports no shortness of breath nor dyspnea on exertion. Reports no chest pain, pressure, or tightness. No edema, orthopnea, PND. Reports no palpitations.    EKGs/Labs/Other Studies Reviewed:   The following studies were reviewed today:   EKG:  EKG is ordered today.  The ekg ordered today demonstrates SB 58 bpm with no acute ST/T wave changes.   Recent Labs: No results found for requested labs within last 365 days.  Recent Lipid Panel No results found for: "CHOL", "TRIG", "HDL", "CHOLHDL", "VLDL", "LDLCALC", "LDLDIRECT"   Home Medications   Current  Meds  Medication Sig   aspirin EC 81 MG tablet Take 81 mg by mouth daily.   ketoconazole (NIZORAL) 2 % cream Apply to aa's QHS until clear. Then PRN recurrence.   latanoprost (XALATAN) 0.005 % ophthalmic solution SMARTSIG:In Eye(s)   loratadine (CLARITIN) 10 MG tablet Take 10 mg by mouth daily.   Multiple Vitamins-Minerals (MULTIVITAMIN ADULT PO) Take by mouth.   Omega-3 Fatty Acids (FISH OIL PO) Take 1 tablet by mouth daily.   omeprazole (PRILOSEC) 40 MG capsule Take 40 mg by mouth daily.   pravastatin (PRAVACHOL) 10 MG tablet Take 10 mg by mouth daily.   PSYLLIUM HUSK PO Take 12 g by mouth daily.   tadalafil (CIALIS) 20 MG tablet Take 10 mg by mouth as needed.   [DISCONTINUED] hydrochlorothiazide (HYDRODIURIL) 25 MG tablet Take 1 tablet (25 mg total) by mouth daily.     Review of Systems      All other systems reviewed and are otherwise negative except as noted above.  Physical Exam    VS:  BP 128/82 (BP Location: Right Arm, Patient Position: Sitting, Cuff Size: Large)   Pulse (!) 58   Ht 5' 10.5" (1.791 m)   Wt 247 lb 3.2 oz (112.1 kg)   BMI 34.97 kg/m  , BMI Body mass index is 34.97 kg/m.  Wt Readings from Last 3 Encounters:  06/23/22 247 lb 3.2  oz (112.1 kg)  10/29/19 281 lb (127.5 kg)  07/05/17 282 lb (127.9 kg)     GEN: Well nourished, well developed, in no acute distress. HEENT: normal. Neck: Supple, no JVD, carotid bruits, or masses. Cardiac: RRR, no murmurs, rubs, or gallops. No clubbing, cyanosis, edema.  Radials/PT 2+ and equal bilaterally.  Respiratory:  Respirations regular and unlabored, clear to auscultation bilaterally. GI: Soft, nontender, nondistended. MS: No deformity or atrophy. Skin: Warm and dry, no rash. Neuro:  Strength and sensation are intact. Psych: Normal affect.  Assessment & Plan    HTN - BP well controlled. Continue current antihypertensive regimen HCTZ '25mg'$  QD.   Hyperlipidemia - 10/2021 total cholesterol 198, LDL 132. 10 year ASCVD  risk 21.5% which is intermediate recommended for moderate intensity statin. He is hesitant regarding additional medical therapy and requests to have lipids rechecked at upcoming visit with PCP to reassess risk. If LDL still >100 and ASCVD risk still intermediate - recommend either increased dose Pravastatin or transition to Rosuvastatin.   OSA - CPAP compliance encouraged.   GERD - Continue to follow with PCP.      Disposition: Follow up in 1 year(s) with Skeet Latch, MD or APP.  Signed, Loel Dubonnet, NP 06/25/2022, 9:34 PM [place]

## 2022-06-23 NOTE — Patient Instructions (Addendum)
Medication Instructions:  Continue your current medications.   *If you need a refill on your cardiac medications before your next appointment, please call your pharmacy*   Lab Work: None ordered today.  Recommend cholesterol labs with your next lab work with Dr. Kary Kos.   Testing/Procedures: Your EKG today looked great!   Follow-Up: At [facility], you and your health needs are our priority.  As part of our continuing mission to provide you with exceptional heart care, we have created designated Provider Care Teams.  These Care Teams include your primary Cardiologist (physician) and Advanced Practice Providers (APPs -  Physician Assistants and Nurse Practitioners) who all work together to provide you with the care you need, when you need it.  We recommend signing up for the patient portal called "MyChart".  Sign up information is provided on this After Visit Summary.  MyChart is used to connect with patients for Virtual Visits (Telemedicine).  Patients are able to view lab/test results, encounter notes, upcoming appointments, etc.  Non-urgent messages can be sent to your provider as well.   To learn more about what you can do with MyChart, go to [email or web address].    Your next appointment:   1 year(s)  The format for your next appointment:   In Person  Provider:   Skeet Latch, MD or Loel Dubonnet, NP    Other Instructions  Heart Healthy Diet Recommendations: A low-salt diet is recommended. Meats should be grilled, baked, or boiled. Avoid fried foods. Focus on lean protein sources like fish or chicken with vegetables and fruits. The American Heart Association is a Microbiologist!  American Heart Association Diet and Lifeystyle Recommendations   Exercise recommendations: The American Heart Association recommends 150 minutes of moderate intensity exercise weekly. Try 30 minutes of moderate intensity exercise 4-5 times per week. This could include walking, jogging,  or swimming.  Important Information About Sugar

## 2022-06-25 ENCOUNTER — Encounter (HOSPITAL_BASED_OUTPATIENT_CLINIC_OR_DEPARTMENT_OTHER): Payer: Self-pay | Admitting: Family

## 2022-12-29 ENCOUNTER — Ambulatory Visit: Payer: Medicare HMO | Admitting: Dermatology

## 2022-12-29 ENCOUNTER — Encounter: Payer: Self-pay | Admitting: Dermatology

## 2022-12-29 VITALS — BP 133/79 | HR 61

## 2022-12-29 DIAGNOSIS — L821 Other seborrheic keratosis: Secondary | ICD-10-CM | POA: Diagnosis not present

## 2022-12-29 DIAGNOSIS — D692 Other nonthrombocytopenic purpura: Secondary | ICD-10-CM | POA: Diagnosis not present

## 2022-12-29 DIAGNOSIS — L814 Other melanin hyperpigmentation: Secondary | ICD-10-CM

## 2022-12-29 NOTE — Progress Notes (Signed)
   Follow-Up Visit   Subjective  Ronald Peters is a 72 y.o. male who presents for the following: lesion (Left chest. Larger, irregular shape. Denies pain or itching. Dark spot on face, dur: couple of months).  The patient has spots, moles and lesions to be evaluated, some may be new or changing and the patient has concerns that these could be cancer.  The following portions of the chart were reviewed this encounter and updated as appropriate:  Tobacco  Allergies  Meds  Problems  Med Hx  Surg Hx  Fam Hx      Review of Systems: No other skin or systemic complaints except as noted in HPI or Assessment and Plan.   Objective  Well appearing patient in no apparent distress; mood and affect are within normal limits.  A focused examination was performed including face, chest. Relevant physical exam findings are noted in the Assessment and Plan.  Left Chest Stuck-on, waxy, tan-brown patch --Discussed benign etiology and prognosis.   Left medial Cheek 0.2 cm brown thin papule with darker focus at 2 o'clock        Assessment & Plan   Purpura - Chronic; persistent and recurrent.  Treatable, but not curable. Arms - Violaceous macules and patches - Benign - Related to trauma, age, sun damage and/or use of blood thinners, chronic use of topical and/or oral steroids - Observe - Can use OTC arnica containing moisturizer such as Dermend Bruise Formula if desired - Call for worsening or other concerns  Seborrheic keratosis (2) Left Chest; Left medial Cheek  Reassured benign-appearing age-related growth.  Recommend observation.  Discussed cryotherapy if spot(s) become irritated or inflamed. Call for new or changing lesions.  Right cheek - macular SK vs lentigo > atypia  Monitor and call for any changes. Recheck at f/u.   Recommend daily broad spectrum sunscreen SPF 30+ to sun-exposed areas, reapply every 2 hours as needed. Call for new or changing lesions.  Staying in the shade or  wearing long sleeves, sun glasses (UVA+UVB protection) and wide brim hats (4-inch brim around the entire circumference of the hat) are also recommended for sun protection.      Lentigines - Scattered tan macules - Due to sun exposure - Benign-appearing, observe - Recommend daily broad spectrum sunscreen SPF 30+ to sun-exposed areas, reapply every 2 hours as needed. - Call for any changes  Return for TBSE As Scheduled.  I, Emelia Salisbury, CMA, am acting as scribe for Forest Gleason, MD.  Documentation: I have reviewed the above documentation for accuracy and completeness, and I agree with the above.  Forest Gleason, MD

## 2022-12-29 NOTE — Patient Instructions (Signed)
Bruising on arms: Can use OTC arnica containing moisturizer such as Dermend Bruise Formula if desired  Recommend daily broad spectrum sunscreen SPF 30+ to sun-exposed areas, reapply every 2 hours as needed. Call for new or changing lesions.  Staying in the shade or wearing long sleeves, sun glasses (UVA+UVB protection) and wide brim hats (4-inch brim around the entire circumference of the hat) are also recommended for sun protection.   Melanoma ABCDEs  Melanoma is the most dangerous type of skin cancer, and is the leading cause of death from skin disease.  You are more likely to develop melanoma if you: Have light-colored skin, light-colored eyes, or red or blond hair Spend a lot of time in the sun Tan regularly, either outdoors or in a tanning bed Have had blistering sunburns, especially during childhood Have a close family member who has had a melanoma Have atypical moles or large birthmarks  Early detection of melanoma is key since treatment is typically straightforward and cure rates are extremely high if we catch it early.   The first sign of melanoma is often a change in a mole or a new dark spot.  The ABCDE system is a way of remembering the signs of melanoma.  A for asymmetry:  The two halves do not match. B for border:  The edges of the growth are irregular. C for color:  A mixture of colors are present instead of an even brown color. D for diameter:  Melanomas are usually (but not always) greater than 62m - the size of a pencil eraser. E for evolution:  The spot keeps changing in size, shape, and color.  Please check your skin once per month between visits. You can use a small mirror in front and a large mirror behind you to keep an eye on the back side or your body.   If you see any new or changing lesions before your next follow-up, please call to schedule a visit.  Please continue daily skin protection including broad spectrum sunscreen SPF 30+ to sun-exposed areas,  reapplying every 2 hours as needed when you're outdoors.   Staying in the shade or wearing long sleeves, sun glasses (UVA+UVB protection) and wide brim hats (4-inch brim around the entire circumference of the hat) are also recommended for sun protection.    Due to recent changes in healthcare laws, you may see results of your pathology and/or laboratory studies on MyChart before the doctors have had a chance to review them. We understand that in some cases there may be results that are confusing or concerning to you. Please understand that not all results are received at the same time and often the doctors may need to interpret multiple results in order to provide you with the best plan of care or course of treatment. Therefore, we ask that you please give u[place]2 business days to thoroughly review all your results before contacting the office for clarification. Should we see a critical lab result, you will be contacted sooner.   If You Need Anything After Your Visit  If you have any questions or concerns for your doctor, please call our main line at [phone] press option 4 to reach your doctor's medical assistant. If no one answers, please leave a voicemail as directed and we will return your call as soon as possible. Messages left after 4 pm will be answered the following business day.   You may also send [place] message via [place] We typically respond to MyChart  messages within 1-2 business days.  For prescription refills, please ask your pharmacy to contact our office. Our fax number is [phone].  If you have an urgent issue when the clinic is closed that cannot wait until the next business day, you can page your doctor at the number below.    Please note that while we do our best to be available for urgent issues outside of office hours, we are not available 24/7.   If you have an urgent issue and are unable to reach [place], you may choose to seek medical care at your doctor's office,  retail clinic, urgent care center, or emergency room.  If you have a medical emergency, please immediately call 911 or go to the emergency department.  Pager Numbers  - Dr. Nehemiah Massed: [phone]  - Dr. Laurence Ferrari: [phone]  - Dr. Nicole Kindred: [phone]  In the event of inclement weather, please call our main line at [phone] for an update on the status of any delays or closures.  Dermatology Medication Tips: Please keep the boxes that topical medications come in in order to help keep track of the instructions about where and how to use these. Pharmacies typically print the medication instructions only on the boxes and not directly on the medication tubes.   If your medication is too expensive, please contact our office at [phone] option 4 or send [place] a message through [place].   We are unable to tell what your co-pay for medications will be in advance as this is different depending on your insurance coverage. However, we may be able to find a substitute medication at lower cost or fill out paperwork to get insurance to cover a needed medication.   If a prior authorization is required to get your medication covered by your insurance company, please allow [place] 1-2 business days to complete this process.  Drug prices often vary depending on where the prescription is filled and some pharmacies may offer cheaper prices.  The website www.goodrx.com contains coupons for medications through different pharmacies. The prices here do not account for what the cost may be with help from insurance (it may be cheaper with your insurance), but the website can give you the price if you did not use any insurance.  - You can print the associated coupon and take it with your prescription to the pharmacy.  - You may also stop by our office during regular business hours and pick up a GoodRx coupon card.  - If you need your prescription sent electronically to a different pharmacy, notify our office through  [facility], [place] or by phone at [phone] option 4.     Si Usted Necesita Algo Despus de Su Visita  Tambin puede enviarnos un mensaje a travs de Pharmacist, community. Por lo general respondemos a los mensajes de MyChart en el transcurso de 1 a 2 das hbiles.  Para renovar recetas, por favor pida a su farmacia que se ponga en contacto con nuestra oficina. Harland Dingwall de fax es [place] [phone].  Si tiene un asunto urgente cuando la clnica est cerrada y que no puede esperar hasta el siguiente da hbil, puede llamar/localizar a su doctor(a) al nmero que aparece a continuacin.   Por favor, tenga en cuenta que aunque hacemos todo lo posible para estar disponibles para asuntos urgentes fuera del horario de [facility], no estamos disponibles las 24 horas del da, los 7 das de la [place].   Si tiene un problema urgente y no puede comunicarse con nosotros, puede optar  por buscar atencin mdica  en el consultorio de su doctor(a), en una clnica privada, en un centro de atencin urgente o en una sala de emergencias.  Si tiene Engineering geologist, por favor llame inmediatamente al 911 o vaya a la sala de emergencias.  Nmeros de bper  - Dr. Nehemiah Massed: [phone]  - Dra. Moye: [phone]  - Dra. Nicole Kindred: [phone]  En caso de inclemencias del [place], por favor llame a Johnsie Kindred principal al [phone] para una actualizacin sobre el [place] de cualquier retraso o cierre.  Consejos para la medicacin en dermatologa: Por favor, guarde las cajas en las que vienen los medicamentos de uso tpico para ayudarle a seguir las instrucciones sobre dnde y cmo usarlos. Las farmacias generalmente imprimen las instrucciones del medicamento slo en las cajas y no directamente en los tubos del [place].   Si su medicamento es muy caro, por favor, pngase en contacto con Zigmund Daniel llamando al [phone] y presione la opcin 4 o envenos un mensaje a travs de Pharmacist, community.   No podemos  decirle cul ser su copago por los medicamentos por adelantado ya que esto es diferente dependiendo de la cobertura de su seguro. Sin embargo, es posible que podamos encontrar un medicamento sustituto a Electrical engineer un formulario para que el seguro cubra el medicamento que se considera necesario.   Si se requiere una autorizacin previa para que su compaa de seguros [place] su medicamento, por favor permtanos de 1 a 2 das hbiles para completar este proceso.  Los precios de los medicamentos varan con frecuencia dependiendo del Environmental consultant de dnde se surte la receta y alguna farmacias pueden ofrecer precios ms baratos.  El sitio web www.goodrx.com tiene cupones para medicamentos de Airline pilot. Los precios aqu no tienen en cuenta lo que podra costar con la ayuda del seguro (puede ser ms barato con su seguro), pero el sitio web puede darle el precio si no utiliz Research scientist (physical sciences).  - Puede imprimir el cupn correspondiente y llevarlo con su receta a la farmacia.  - Tambin puede pasar por nuestra oficina durante el horario de atencin regular y Charity fundraiser una tarjeta de cupones de GoodRx.  - Si necesita que su receta se enve electrnicamente a una farmacia diferente, informe a nuestra oficina a travs de MyChart de [place] o por telfono llamando al [phone] y presione la opcin 4.

## 2023-01-09 ENCOUNTER — Encounter: Payer: Self-pay | Admitting: Dermatology

## 2023-03-29 ENCOUNTER — Ambulatory Visit: Payer: Medicare HMO | Admitting: Dermatology

## 2023-03-29 VITALS — BP 148/81

## 2023-03-29 DIAGNOSIS — D1801 Hemangioma of skin and subcutaneous tissue: Secondary | ICD-10-CM

## 2023-03-29 DIAGNOSIS — L570 Actinic keratosis: Secondary | ICD-10-CM

## 2023-03-29 DIAGNOSIS — I999 Unspecified disorder of circulatory system: Secondary | ICD-10-CM

## 2023-03-29 DIAGNOSIS — D229 Melanocytic nevi, unspecified: Secondary | ICD-10-CM | POA: Diagnosis not present

## 2023-03-29 DIAGNOSIS — Z1283 Encounter for screening for malignant neoplasm of skin: Secondary | ICD-10-CM

## 2023-03-29 DIAGNOSIS — L578 Other skin changes due to chronic exposure to nonionizing radiation: Secondary | ICD-10-CM

## 2023-03-29 DIAGNOSIS — L814 Other melanin hyperpigmentation: Secondary | ICD-10-CM

## 2023-03-29 DIAGNOSIS — L821 Other seborrheic keratosis: Secondary | ICD-10-CM

## 2023-03-29 NOTE — Patient Instructions (Addendum)
Cryotherapy Aftercare  Wash gently with soap and water everyday.   Apply Vaseline and Band-Aid daily until healed.     Due to recent changes in healthcare laws, you may see results of your pathology and/or laboratory studies on MyChart before the doctors have had a chance to review them. We understand that in some cases there may be results that are confusing or concerning to you. Please understand that not all results are received at the same time and often the doctors may need to interpret multiple results in order to provide you with the best plan of care or course of treatment. Therefore, we ask that you please give us 2 business days to thoroughly review all your results before contacting the office for clarification. Should we see a critical lab result, you will be contacted sooner.   If You Need Anything After Your Visit  If you have any questions or concerns for your doctor, please call our main line at [phone] and press option 4 to reach your doctor's medical assistant. If no one answers, please leave a voicemail as directed and we will return your call as soon as possible. Messages left after 4 pm will be answered the following business day.   You may also send us a message via MyChart. We typically respond to MyChart messages within 1-2 business days.  For prescription refills, please ask your pharmacy to contact our office. Our fax number is [phone].  If you have an urgent issue when the clinic is closed that cannot wait until the next business day, you can page your doctor at the number below.    Please note that while we do our best to be available for urgent issues outside of office hours, we are not available 24/7.   If you have an urgent issue and are unable to reach us, you may choose to seek medical care at your doctor's office, retail clinic, urgent care center, or emergency room.  If you have a medical emergency, please immediately call 911 or go to the  emergency department.  Pager Numbers  - Dr. Kowalski: [phone]  - Dr. Moye: [phone]  - Dr. Stewart: [phone]  In the event of inclement weather, please call our main line at [phone] for an update on the status of any delays or closures.  Dermatology Medication Tips: Please keep the boxes that topical medications come in in order to help keep track of the instructions about where and how to use these. Pharmacies typically print the medication instructions only on the boxes and not directly on the medication tubes.   If your medication is too expensive, please contact our office at [phone] option 4 or send us a message through MyChart.   We are unable to tell what your co-pay for medications will be in advance as this is different depending on your insurance coverage. However, we may be able to find a substitute medication at lower cost or fill out paperwork to get insurance to cover a needed medication.   If a prior authorization is required to get your medication covered by your insurance company, please allow us 1-2 business days to complete this process.  Drug prices often vary depending on where the prescription is filled and some pharmacies may offer cheaper prices.  The website www.goodrx.com contains coupons for medications through different pharmacies. The prices here do not account for what the cost may be with help from insurance (it may be cheaper with your insurance), but the website can   give you the price if you did not use any insurance.  - You can print the associated coupon and take it with your prescription to the pharmacy.  - You may also stop by our office during regular business hours and pick up a GoodRx coupon card.  - If you need your prescription sent electronically to a different pharmacy, notify our office through [facility] MyChart or by phone at [phone] option 4.     Si Usted Necesita Algo Despus de Su Visita  Tambin puede  enviarnos un mensaje a travs de MyChart. Por lo general respondemos a los mensajes de MyChart en el transcurso de 1 a 2 das hbiles.  Para renovar recetas, por favor pida a su farmacia que se ponga en contacto con nuestra oficina. Nuestro nmero de fax es el [phone].  Si tiene un asunto urgente cuando la clnica est cerrada y que no puede esperar hasta el siguiente da hbil, puede llamar/localizar a su doctor(a) al nmero que aparece a continuacin.   Por favor, tenga en cuenta que aunque hacemos todo lo posible para estar disponibles para asuntos urgentes fuera del horario de oficina, no estamos disponibles las 24 horas del da, los 7 das de la semana.   Si tiene un problema urgente y no puede comunicarse con nosotros, puede optar por buscar atencin mdica  en el consultorio de su doctor(a), en una clnica privada, en un centro de atencin urgente o en una sala de emergencias.  Si tiene una emergencia mdica, por favor llame inmediatamente al 911 o vaya a la sala de emergencias.  Nmeros de bper  - Dr. Kowalski: [phone]  - Dra. Moye: [phone]  - Dra. Stewart: [phone]  En caso de inclemencias del tiempo, por favor llame a nuestra lnea principal al [phone] para una actualizacin sobre el estado de cualquier retraso o cierre.  Consejos para la medicacin en dermatologa: Por favor, guarde las cajas en las que vienen los medicamentos de uso tpico para ayudarle a seguir las instrucciones sobre dnde y cmo usarlos. Las farmacias generalmente imprimen las instrucciones del medicamento slo en las cajas y no directamente en los tubos del medicamento.   Si su medicamento es muy caro, por favor, pngase en contacto con nuestra oficina llamando al [phone] y presione la opcin 4 o envenos un mensaje a travs de MyChart.   No podemos decirle cul ser su copago por los medicamentos por adelantado ya que esto es diferente dependiendo de la cobertura de su seguro.  Sin embargo, es posible que podamos encontrar un medicamento sustituto a menor costo o llenar un formulario para que el seguro cubra el medicamento que se considera necesario.   Si se requiere una autorizacin previa para que su compaa de seguros cubra su medicamento, por favor permtanos de 1 a 2 das hbiles para completar este proceso.  Los precios de los medicamentos varan con frecuencia dependiendo del lugar de dnde se surte la receta y alguna farmacias pueden ofrecer precios ms baratos.  El sitio web www.goodrx.com tiene cupones para medicamentos de diferentes farmacias. Los precios aqu no tienen en cuenta lo que podra costar con la ayuda del seguro (puede ser ms barato con su seguro), pero el sitio web puede darle el precio si no utiliz ningn seguro.  - Puede imprimir el cupn correspondiente y llevarlo con su receta a la farmacia.  - Tambin puede pasar por nuestra oficina durante el horario de atencin regular y recoger una tarjeta de cupones de GoodRx.  -   Si necesita que su receta se enve electrnicamente a una farmacia diferente, informe a nuestra oficina a travs de MyChart de [facility] o por telfono llamando al [phone] y presione la opcin 4.  

## 2023-03-29 NOTE — Progress Notes (Signed)
Follow-Up Visit   Subjective  Ronald Peters is a 72 y.o. male who presents for the following: Skin Cancer Screening and Full Body Skin Exam, hx of Aks, check scaly spot L nose The patient presents for Total-Body Skin Exam (TBSE) for skin cancer screening and mole check. The patient has spots, moles and lesions to be evaluated, some may be new or changing and the patient has concerns that these could be cancer.  The following portions of the chart were reviewed this encounter and updated as appropriate: medications, allergies, medical history  Review of Systems:  No other skin or systemic complaints except as noted in HPI or Assessment and Plan.  Objective  Well appearing patient in no apparent distress; mood and affect are within normal limits.  A full examination was performed including scalp, head, eyes, ears, nose, lips, neck, chest, axillae, abdomen, back, buttocks, bilateral upper extremities, bilateral lower extremities, hands, feet, fingers, toes, fingernails, and toenails. All findings within normal limits unless otherwise noted below.   Relevant physical exam findings are noted in the Assessment and Plan.   Assessment & Plan   LENTIGINES, SEBORRHEIC KERATOSES, HEMANGIOMAS - Benign normal skin lesions - Benign-appearing - Call for any changes  MELANOCYTIC NEVI - Tan-brown and/or pink-flesh-colored symmetric macules and papules - Benign appearing on exam today - Observation - Call clinic for new or changing moles - Recommend daily use of broad spectrum spf 30+ sunscreen to sun-exposed areas.   ACTINIC DAMAGE - Chronic condition, secondary to cumulative UV/sun exposure - diffuse scaly erythematous macules with underlying dyspigmentation - Recommend daily broad spectrum sunscreen SPF 30+ to sun-exposed areas, reapply every 2 hours as needed.  - Staying in the shade or wearing long sleeves, sun glasses (UVA+UVB protection) and wide brim hats (4-inch brim around the entire  circumference of the hat) are also recommended for sun protection.  - Call for new or changing lesions.  SKIN CANCER SCREENING PERFORMED TODAY.  ACTINIC KERATOSIS Exam: Erythematous thin papules/macules with gritty scale  Actinic keratoses are precancerous spots that appear secondary to cumulative UV radiation exposure/sun exposure over time. They are chronic with expected duration over 1 year. A portion of actinic keratoses will progress to squamous cell carcinoma of the skin. It is not possible to reliably predict which spots will progress to skin cancer and so treatment is recommended to prevent development of skin cancer.  Recommend daily broad spectrum sunscreen SPF 30+ to sun-exposed areas, reapply every 2 hours as needed.  Recommend staying in the shade or wearing long sleeves, sun glasses (UVA+UVB protection) and wide brim hats (4-inch brim around the entire circumference of the hat). Call for new or changing lesions.  Treatment Plan:  Prior to procedure, discussed risks of blister formation, small wound, skin dyspigmentation, or rare scar following cryotherapy. Recommend Vaseline ointment to treated areas while healing.  Destruction Procedure Note Destruction method: cryotherapy   Informed consent: discussed and consent obtained   Lesion destroyed using liquid nitrogen: Yes   Outcome: patient tolerated procedure well with no complications   Post-procedure details: wound care instructions given   Locations: scalp, L cheek # of Lesions Treated: 7    VASCULAR BIRTHMARK Post neck Exam: violaceous macule Treatment Plan: Benign-appearing.  Observation.  Call clinic for new or changing lesions.  Recommend daily use of broad spectrum spf 30+ sunscreen to sun-exposed areas.     Return in about 1 year (around 03/28/2024) for TBSE, Hx of AKs.  I, Sonya Hupman, RMA, am acting as  scribe for Armida Sans, MD .  Documentation: I have reviewed the above documentation for accuracy and  completeness, and I agree with the above.  Armida Sans, MD

## 2023-04-14 ENCOUNTER — Encounter: Payer: Self-pay | Admitting: Dermatology

## 2023-06-06 ENCOUNTER — Other Ambulatory Visit (HOSPITAL_BASED_OUTPATIENT_CLINIC_OR_DEPARTMENT_OTHER): Payer: Self-pay | Admitting: Family

## 2023-06-06 DIAGNOSIS — I10 Essential (primary) hypertension: Secondary | ICD-10-CM

## 2023-06-06 NOTE — Telephone Encounter (Signed)
Rx request sent to pharmacy.  

## 2023-07-04 ENCOUNTER — Encounter (HOSPITAL_BASED_OUTPATIENT_CLINIC_OR_DEPARTMENT_OTHER): Payer: Self-pay | Admitting: Family

## 2023-07-04 ENCOUNTER — Ambulatory Visit (HOSPITAL_BASED_OUTPATIENT_CLINIC_OR_DEPARTMENT_OTHER): Payer: Medicare HMO | Admitting: Family

## 2023-07-04 VITALS — BP 128/84 | HR 57 | Ht 70.5 in | Wt 262.6 lb

## 2023-07-04 DIAGNOSIS — E782 Mixed hyperlipidemia: Secondary | ICD-10-CM | POA: Diagnosis not present

## 2023-07-04 DIAGNOSIS — R001 Bradycardia, unspecified: Secondary | ICD-10-CM

## 2023-07-04 DIAGNOSIS — I10 Essential (primary) hypertension: Secondary | ICD-10-CM | POA: Diagnosis not present

## 2023-07-04 DIAGNOSIS — K219 Gastro-esophageal reflux disease without esophagitis: Secondary | ICD-10-CM

## 2023-07-04 DIAGNOSIS — G4733 Obstructive sleep apnea (adult) (pediatric): Secondary | ICD-10-CM

## 2023-07-04 MED ORDER — HYDROCHLOROTHIAZIDE 25 MG PO TABS: 25.0000 mg | ORAL_TABLET | Freq: Every day | ORAL | 3 refills | Status: DC

## 2023-07-04 NOTE — Progress Notes (Unsigned)
Cardiology Office Note:  .   Date:  07/04/2023  ID:  Ronald Peters, DOB 06/06/1951, MRN [id number] PCP: Jerl Mina, MD  [facility] Providers Cardiologist:  Chilton Si, MD { Click to update primary MD,subspecialty MD or APP then REFRESH:1}   History of Present Illness: .   Ronald Peters is a 72 y.o. male  with a hx of hypertension, ED, sleep apnea last seen 06/23/22.   Prior cardiac testing includes treadmill test 2018 negative for ischemia. Seen via video visit 04/2020 doing well from cardiac perspective. Last seen 06/23/22 doing well with no changes at that time. His LDL was noted to be 132 with 10-year ASCVD risk score 21.5% however he preferred to recheck lipids with PCP prior to adjusting Pravastatin.    10/10/22 total cholesterol 159, HDL 38, triglycerides 90, LDL 103. ***  He presents today for follow up. Trip to [place] in May which he and his wife enjoyed. Did have COVID on his return but symptoms overall mild which he has recovered from. Succeeded last year in losing weight trhough walking ***. Got A1c to below prediabetes levels. Notes a low heart rate at home - has seen as low as 50 bpm. He is taking iron supplement due to some low hemoglobin.   Marland Kitchen He and his wife moved from [place] to [place] and enjoy the area. They enjoy walking at different parks together for exercise and have been trying to e more active. Reports no shortness of breath nor dyspnea on exertion. Reports no chest pain, pressure, or tightness. No edema, orthopnea, PND. Reports no palpitations.      ROS: Please see the history of present illness.    All other systems reviewed and are negative.   Studies Reviewed: Marland Kitchen   EKG Interpretation Date/Time:  Tuesday July 04 2023 13:49:14 EDT Ventricular Rate:  57 PR Interval:  176 QRS Duration:  98 QT Interval:  424 QTC Calculation: 412 R Axis:   -15  Text Interpretation: Sinus bradycardia  No acute ST/T wave changes. Confirmed by Gillian Shields  ([id number]) on 07/04/2023 2:11:20 PM    Cardiac Studies & Procedures     STRESS TESTS  EXERCISE TOLERANCE TEST (ETT) 05/10/2017  Narrative  Blood pressure demonstrated a hypertensive response to exercise.  There was no ST segment deviation noted during stress.  Negative, adequate stress test.              Risk Assessment/Calculations:             Physical Exam:   VS:  BP 128/84 (BP Location: Right Arm, Patient Position: Sitting, Cuff Size: Large)   Pulse (!) 57   Ht 5' 10.5" (1.791 m)   Wt 262 lb 9.6 oz (119.1 kg)   BMI 37.15 kg/m    Wt Readings from Last 3 Encounters:  07/04/23 262 lb 9.6 oz (119.1 kg)  06/23/22 247 lb 3.2 oz (112.1 kg)  10/29/19 281 lb (127.5 kg)    GEN: Well nourished, well developed in no acute distress NECK: No JVD; No carotid bruits CARDIAC: ***RRR, no murmurs, rubs, gallops RESPIRATORY:  Clear to auscultation without rales, wheezing or rhonchi  ABDOMEN: Soft, non-tender, non-distended EXTREMITIES:  No edema; No deformity   ASSESSMENT AND PLAN: .    HTN - BP well controlled. Continue current antihypertensive regimen HCTZ 25mg  QD. Refill provided.    Hyperlipidemia - 10/2021 total cholesterol 198, LDL 132. 10/10/22 total cholesterol 159, HDL 38, triglycerides 90, LDL 103. 10 year ASCVD risk score 21%. ***  OSA - CPAP compliance encouraged. Endorses using regularly.    GERD - Continue to follow with PCP.        Dispo: follow up in one year  Signed, Alver Sorrow, NP

## 2023-07-04 NOTE — Patient Instructions (Addendum)
Medication Instructions:  Continue your current medications.  *If you need a refill on your cardiac medications before your next appointment, please call your pharmacy*  Testing/Procedures: Your EKG today shows sinus bradycardia. This is a regular heart rhythm that is a little slow. This is not of concern unless you are noticing lightheadedness, dizziness. If this becomes bothersome, we could consider having you wear a heart monitor.   Follow-Up: At [facility], you and your health needs are our priority.  As part of our continuing mission to provide you with exceptional heart care, we have created designated Provider Care Teams.  These Care Teams include your primary Cardiologist (physician) and Advanced Practice Providers (APPs -  Physician Assistants and Nurse Practitioners) who all work together to provide you with the care you need, when you need it.  We recommend signing up for the patient portal called "MyChart".  Sign up information is provided on this After Visit Summary.  MyChart is used to connect with patients for Virtual Visits (Telemedicine).  Patients are able to view lab/test results, encounter notes, upcoming appointments, etc.  Non-urgent messages can be sent to your provider as well.   To learn more about what you can do with MyChart, go to [email or web address].    Your next appointment:   1 year(s)  Provider:   Chilton Si, MD or Gillian Shields, NP    Other Instructions  Heart Healthy Diet Recommendations: A low-salt diet is recommended. Meats should be grilled, baked, or boiled. Avoid fried foods. Focus on lean protein sources like fish or chicken with vegetables and fruits. The American Heart Association is a Chief Technology Officer!  American Heart Association Diet and Lifeystyle Recommendations   Exercise recommendations: The American Heart Association recommends 150 minutes of moderate intensity exercise weekly. Try 30 minutes of moderate intensity exercise  4-5 times per week. This could include walking, jogging, or swimming.   If you would like to have a coronary calcium score simply let us know. This is a $99 CT scan that does not use contrast that tells [place] if there is any plaque build up in the coronary arteries.

## 2023-08-01 ENCOUNTER — Encounter (HOSPITAL_BASED_OUTPATIENT_CLINIC_OR_DEPARTMENT_OTHER): Payer: Self-pay

## 2023-08-01 ENCOUNTER — Telehealth (HOSPITAL_BASED_OUTPATIENT_CLINIC_OR_DEPARTMENT_OTHER): Payer: Self-pay | Admitting: Family

## 2023-08-01 DIAGNOSIS — E782 Mixed hyperlipidemia: Secondary | ICD-10-CM

## 2023-08-01 NOTE — Telephone Encounter (Signed)
Spoke with patient regarding the calcium scoring study that was ordered by Gillian Shields, NP---patient is scheduled Tuesday 08/15/23 at 10:00 am her at the Imaging Department at [facility] (ground floor)--arrival time is 9:45 am for check in---patient voiced his understanding

## 2023-08-15 ENCOUNTER — Ambulatory Visit (HOSPITAL_BASED_OUTPATIENT_CLINIC_OR_DEPARTMENT_OTHER)
Admission: RE | Admit: 2023-08-15 | Discharge: 2023-08-15 | Disposition: A | Discharge status: Home / Self Care | Payer: Medicare HMO | Source: Ambulatory Visit | Attending: Family | Admitting: Family

## 2023-08-15 DIAGNOSIS — E782 Mixed hyperlipidemia: Secondary | ICD-10-CM | POA: Insufficient documentation

## 2023-08-16 ENCOUNTER — Telehealth (HOSPITAL_BASED_OUTPATIENT_CLINIC_OR_DEPARTMENT_OTHER): Payer: Self-pay

## 2023-08-16 DIAGNOSIS — I77810 Thoracic aortic ectasia: Secondary | ICD-10-CM

## 2023-08-16 NOTE — Telephone Encounter (Addendum)
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 We keep this from progressing by keeping BP well controlled.   Given coronary calcification, would like LDL (bad cholesterol) to be less than 70. It was 103 on last check, recommend increase Pravastatin to 20mg  daily with repeat FLP/LFT in 2 months.   Given mild dilation of ascending aorta, recommend CT of aorta in 6 months to ensure stability.

## 2023-08-16 NOTE — Telephone Encounter (Signed)
Seen by patient Ronald Peters on 08/16/2023 12:26 PM; follow up mychart message sent to patient. Orders placed.

## 2023-08-16 NOTE — Addendum Note (Signed)
Addended by: Marlene Lard on: 08/16/2023 01:39 PM   Modules accepted: Orders

## 2024-03-28 ENCOUNTER — Encounter (HOSPITAL_BASED_OUTPATIENT_CLINIC_OR_DEPARTMENT_OTHER): Payer: Self-pay

## 2024-03-28 NOTE — Telephone Encounter (Signed)
 FYI

## 2024-04-01 NOTE — Telephone Encounter (Signed)
 Patient has been added to wait list.

## 2024-04-10 ENCOUNTER — Encounter: Payer: Self-pay | Admitting: Dermatology

## 2024-04-10 ENCOUNTER — Ambulatory Visit: Payer: Medicare HMO | Admitting: Dermatology

## 2024-04-10 DIAGNOSIS — L820 Inflamed seborrheic keratosis: Secondary | ICD-10-CM

## 2024-04-10 DIAGNOSIS — L821 Other seborrheic keratosis: Secondary | ICD-10-CM

## 2024-04-10 DIAGNOSIS — L570 Actinic keratosis: Secondary | ICD-10-CM | POA: Diagnosis not present

## 2024-04-10 DIAGNOSIS — L814 Other melanin hyperpigmentation: Secondary | ICD-10-CM

## 2024-04-10 DIAGNOSIS — B079 Viral wart, unspecified: Secondary | ICD-10-CM

## 2024-04-10 DIAGNOSIS — D1801 Hemangioma of skin and subcutaneous tissue: Secondary | ICD-10-CM

## 2024-04-10 DIAGNOSIS — Z7189 Other specified counseling: Secondary | ICD-10-CM

## 2024-04-10 DIAGNOSIS — L738 Other specified follicular disorders: Secondary | ICD-10-CM

## 2024-04-10 DIAGNOSIS — Z1283 Encounter for screening for malignant neoplasm of skin: Secondary | ICD-10-CM | POA: Diagnosis not present

## 2024-04-10 DIAGNOSIS — L578 Other skin changes due to chronic exposure to nonionizing radiation: Secondary | ICD-10-CM | POA: Diagnosis not present

## 2024-04-10 DIAGNOSIS — B070 Plantar wart: Secondary | ICD-10-CM | POA: Diagnosis not present

## 2024-04-10 DIAGNOSIS — W908XXA Exposure to other nonionizing radiation, initial encounter: Secondary | ICD-10-CM | POA: Diagnosis not present

## 2024-04-10 DIAGNOSIS — D229 Melanocytic nevi, unspecified: Secondary | ICD-10-CM

## 2024-04-10 NOTE — Progress Notes (Signed)
 Follow-Up Visit   Subjective  Ronald Peters is a 73 y.o. male who presents for the following: Skin Cancer Screening and Full Body Skin Exam  The patient presents for Total-Body Skin Exam (TBSE) for skin cancer screening and mole check. The patient has spots, moles and lesions to be evaluated, some may be new or changing and the patient may have concern these could be cancer.  The following portions of the chart were reviewed this encounter and updated as appropriate: medications, allergies, medical history  Review of Systems:  No other skin or systemic complaints except as noted in HPI or Assessment and Plan.  Objective  Well appearing patient in no apparent distress; mood and affect are within normal limits.  A full examination was performed including scalp, head, eyes, ears, nose, lips, neck, chest, axillae, abdomen, back, buttocks, bilateral upper extremities, bilateral lower extremities, hands, feet, fingers, toes, fingernails, and toenails. All findings within normal limits unless otherwise noted below.   Relevant physical exam findings are noted in the Assessment and Plan.  Scalp and face x 10 (10) Erythematous thin papules/macules with gritty scale.  L chest -- If not resolved in 2 mos, return to clinic Erythematous stuck-on, waxy papule or plaque  R plantar foot Verrucous papules -- Discussed viral etiology and contagion.   Assessment & Plan   SKIN CANCER SCREENING PERFORMED TODAY.  ACTINIC DAMAGE - Chronic condition, secondary to cumulative UV/sun exposure - diffuse scaly erythematous macules with underlying dyspigmentation - Recommend daily broad spectrum sunscreen SPF 30+ to sun-exposed areas, reapply every 2 hours as needed.  - Staying in the shade or wearing long sleeves, sun glasses (UVA+UVB protection) and wide brim hats (4-inch brim around the entire circumference of the hat) are also recommended for sun protection.  - Call for new or changing  lesions.  LENTIGINES, SEBORRHEIC KERATOSES, HEMANGIOMAS - Benign normal skin lesions - Benign-appearing - Call for any changes  MELANOCYTIC NEVI - Tan-brown and/or pink-flesh-colored symmetric macules and papules - Benign appearing on exam today - Observation - Call clinic for new or changing moles - Recommend daily use of broad spectrum spf 30+ sunscreen to sun-exposed areas.   VASCULAR BIRTHMARK Post neck Exam: violaceous macule Treatment Plan: Benign-appearing.  Observation.  Call clinic for new or changing lesions.  Recommend daily use of broad spectrum spf 30+ sunscreen to  sun-exposed areas.     Sebaceous Hyperplasia - R nose  - Small yellow papules with a central dell - Benign-appearing - Observe. Call for changes. - Discussed out of pocket cost of $60 for initial lesion and $15 for each thereafter.  AK (ACTINIC KERATOSIS) (10) Scalp and face x 10 (10) Actinic keratoses are precancerous spots that appear secondary to cumulative UV radiation exposure/sun exposure over time. They are chronic with expected duration over 1 year. A portion of actinic keratoses will progress to squamous cell carcinoma of the skin. It is not possible to reliably predict which spots will progress to skin cancer and so treatment is recommended to prevent development of skin cancer.  Recommend daily broad spectrum sunscreen SPF 30+ to sun-exposed areas, reapply every 2 hours as needed.  Recommend staying in the shade or wearing long sleeves, sun glasses (UVA+UVB protection) and wide brim hats (4-inch brim around the entire circumference of the hat). Call for new or changing lesions.  Destruction of lesion - Scalp and face x 10 (10) Complexity: simple   Destruction method: cryotherapy   Informed consent: discussed and consent obtained  Timeout:  patient name, date of birth, surgical site, and procedure verified Lesion destroyed using liquid nitrogen: Yes   Region frozen until ice ball extended  beyond lesion: Yes   Outcome: patient tolerated procedure well with no complications   Post-procedure details: wound care instructions given   INFLAMED SEBORRHEIC KERATOSIS L chest -- If not resolved in 2 mos, return to clinic Symptomatic, irritating, patient would like treated.  Destruction of lesion - L chest -- If not resolved in 2 mos, return to clinic Complexity: simple   Destruction method: cryotherapy   Informed consent: discussed and consent obtained   Timeout:  patient name, date of birth, surgical site, and procedure verified Lesion destroyed using liquid nitrogen: Yes   Region frozen until ice ball extended beyond lesion: Yes   Outcome: patient tolerated procedure well with no complications   Post-procedure details: wound care instructions given   VIRAL WARTS, UNSPECIFIED TYPE R plantar foot Viral Wart (HPV) Counseling  Discussed viral / HPV (Human Papilloma Virus) etiology and risk of spread /infectivity to other areas of body as well as to other people.  Multiple treatments and methods may be required to clear warts and it is possible treatment may not be successful.  Treatment risks include discoloration; scarring and there is still potential for wart recurrence.  Return in about 1 year (around 04/10/2025) for TBSE - hx AK.  Arlinda Lais, CMA, am acting as scribe for Celine Collard, MD .  Documentation: I have reviewed the above documentation for accuracy and completeness, and I agree with the above.  Celine Collard, MD

## 2024-04-10 NOTE — Patient Instructions (Signed)

## 2024-05-02 ENCOUNTER — Other Ambulatory Visit: Payer: Self-pay

## 2024-05-02 ENCOUNTER — Encounter: Payer: Self-pay | Admitting: Dermatology

## 2024-05-02 DIAGNOSIS — L304 Erythema intertrigo: Secondary | ICD-10-CM

## 2024-05-02 MED ORDER — KETOCONAZOLE 2 % EX CREA: TOPICAL_CREAM | CUTANEOUS | 1 refills | Status: DC

## 2024-08-22 ENCOUNTER — Other Ambulatory Visit (HOSPITAL_BASED_OUTPATIENT_CLINIC_OR_DEPARTMENT_OTHER): Payer: Self-pay | Admitting: Family

## 2024-08-22 DIAGNOSIS — I10 Essential (primary) hypertension: Secondary | ICD-10-CM

## 2024-09-10 ENCOUNTER — Encounter (HOSPITAL_BASED_OUTPATIENT_CLINIC_OR_DEPARTMENT_OTHER): Payer: Self-pay | Admitting: Family

## 2024-09-10 ENCOUNTER — Ambulatory Visit (HOSPITAL_BASED_OUTPATIENT_CLINIC_OR_DEPARTMENT_OTHER): Admitting: Family

## 2024-09-10 VITALS — BP 142/84 | HR 83 | Resp 17 | Ht 70.0 in | Wt 256.0 lb

## 2024-09-10 DIAGNOSIS — G4733 Obstructive sleep apnea (adult) (pediatric): Secondary | ICD-10-CM

## 2024-09-10 DIAGNOSIS — I25118 Atherosclerotic heart disease of native coronary artery with other forms of angina pectoris: Secondary | ICD-10-CM | POA: Diagnosis not present

## 2024-09-10 DIAGNOSIS — E782 Mixed hyperlipidemia: Secondary | ICD-10-CM | POA: Diagnosis not present

## 2024-09-10 DIAGNOSIS — I10 Essential (primary) hypertension: Secondary | ICD-10-CM

## 2024-09-10 MED ORDER — HYDROCHLOROTHIAZIDE 25 MG PO TABS: 25.0000 mg | ORAL_TABLET | Freq: Every day | ORAL | 3 refills | Status: DC

## 2024-09-10 NOTE — Patient Instructions (Signed)
 Medication Instructions:  Continue your current medications.  *If you need a refill on your cardiac medications before your next appointment, please call your pharmacy*  Follow-Up: At [facility], you and your health needs are our priority.  As part of our continuing mission to provide you with exceptional heart care, our providers are all part of one team.  This team includes your primary Cardiologist (physician) and Advanced Practice Providers or APPs (Physician Assistants and Nurse Practitioners) who all work together to provide you with the care you need, when you need it.  Your next appointment:   1 year(s)  Provider:   Annabella Scarce, MD, Rosaline Bane, NP, or Reche Finder, NP    We recommend signing up for the patient portal called MyChart.  Sign up information is provided on this After Visit Summary.  MyChart is used to connect with patients for Virtual Visits (Telemedicine).  Patients are able to view lab/test results, encounter notes, upcoming appointments, etc.  Non-urgent messages can be sent to your provider as well.   To learn more about what you can do with MyChart, go to [email or web address].   Other Instructions  Heart Healthy Diet Recommendations: A low-salt diet is recommended. Meats should be grilled, baked, or boiled. Avoid fried foods. Focus on lean protein sources like fish or chicken with vegetables and fruits. The American Heart Association is a Chief Technology Officer!  American Heart Association Diet and Lifeystyle Recommendations   Exercise recommendations: The American Heart Association recommends 150 minutes of moderate intensity exercise weekly. Try 30 minutes of moderate intensity exercise 4-5 times per week. This could include walking, jogging, or swimming.

## 2024-09-10 NOTE — Progress Notes (Signed)
 Cardiology Office Note:  .   Date:  09/10/2024  ID:  Ronald Peters, DOB [birth year], MRN [id number] PCP: Valora Agent, MD  [facility] Providers Cardiologist:  Annabella Scarce, MD    History of Present Illness: .   Ronald Peters is a 73 y.o. male  with a hx of hypertension, ED, sleep apnea, coronary artery disease, PAC, SVT.   Prior cardiac testing includes treadmill test 2018 negative for ischemia. Seen via video visit 04/2020 doing well from cardiac perspective. Last seen 06/23/22 doing well with no changes at that time. His LDL was noted to be 132 with 10-year ASCVD risk score 21.5% however he preferred to recheck lipids with PCP prior to adjusting Pravastatin.    He was last seen 07/04/23 doing well from a cardiac perspective. He was active with resting heart rate as low as 50 bpm which was asymptomatic. To quantify lipid goals with calcium score of 108 placing him in 48th percentile. Pravastatin was increased to 20mg  daily.   He wore a monitor thought his insurance company and sent us  the result via MyChart with SVT >= 5 episodes, PVC/PAC burden <3% which was not triggered.   Presents today for follow up. BP sat home has ben mildly elevated at home 130s. He ntoes prior to his visit today had a call from a disgruntled [employer] member prior to his visit. New CPAP about 18 months ago through [place] [facility]. Wearing routinely throughout the night and waking feeling well rested. Continues to walk for exercise. Also recently started golfing. Has started using a vibration plate with improvement in LE edema. Lost about 15 lbs notes he was able to get his A1c out of diabetic range through lifestyle changes.   Recent BP at other clinic visits: 132/82 130/88  ROS: Please see the history of present illness.    All other systems reviewed and are negative.   Studies Reviewed: SABRA   EKG Interpretation Date/Time:  Tuesday September 10 2024 10:08:58 EDT Ventricular Rate:  71 PR Interval:  166 QRS  Duration:  98 QT Interval:  392 QTC Calculation: 425 R Axis:   -23  Text Interpretation: Sinus rhythm with Premature atrial complexes Confirmed by Vannie Mora ([id number]) on 09/10/2024 10:10:23 AM    Cardiac Studies & Procedures   ______________________________________________________________________________________________   STRESS TESTS  EXERCISE TOLERANCE TEST (ETT) 05/10/2017  Interpretation Summary  Blood pressure demonstrated a hypertensive response to exercise.  There was no ST segment deviation noted during stress.  Negative, adequate stress test.        CT SCANS  CT CARDIAC SCORING (SELF PAY ONLY) 08/15/2023  Addendum 08/21/2023  2:42 PM ADDENDUM REPORT: 08/21/2023 14:40  EXAM: OVER-READ INTERPRETATION  CT CHEST  The following report is an over-read performed by radiologist Dr. Fonda Mom [facility] on 08/21/2023. This over-read does not include interpretation of cardiac or coronary anatomy or pathology. The coronary calcium score interpretation by the cardiologist is attached.  COMPARISON:  None.  FINDINGS: Cardiovascular: Atheromatous changes of the aorta. See findings discussed in the body of the report.  Mediastinum/Nodes: No suspicious adenopathy identified. Imaged mediastinal structures are unremarkable.  Lungs/Pleura: Imaged lungs are clear. No pleural effusion or pneumothorax.  Upper Abdomen: No acute abnormality.  Musculoskeletal: No chest wall abnormality. No acute or significant osseous findings.  IMPRESSION: No significant extracardiac incidental findings identified.   Electronically Signed By: Fonda Field M.D. On: 08/21/2023 14:40  Narrative CLINICAL DATA:  Cardiovascular Disease Risk stratification  EXAM: Coronary Calcium Score  TECHNIQUE: A gated, non-contrast computed tomography scan of the heart was performed using 3mm slice thickness. Axial images were analyzed on a dedicated workstation. Calcium  scoring of the coronary arteries was performed using the Agatston method.  FINDINGS: Coronary arteries: Normal origins.  Coronary Calcium Score:  Left main: 0  Left anterior descending artery: 42.7  Left circumflex artery: 0  Right coronary artery: 65.2  Total: 108  Percentile: 45th  Pericardium: Normal.  Ascending Aorta: Mildly dilated measuring 40mm at the level of the bifurcation of the main pulmonary artery. Recommend dedicated gated Chest CTA for further evaluation.  Non-cardiac: See separate report from [facility].  IMPRESSION: Coronary calcium score of 108. This was 45th percentile for age-, race-, and sex-matched controls.  Mildly dilated measuring 40mm at the level of the bifurcation of the main pulmonary artery. Recommend dedicated gated Chest CTA for further evaluation.  RECOMMENDATIONS: Coronary artery calcium (CAC) score is a strong predictor of incident coronary heart disease (CHD) and provides predictive information beyond traditional risk factors. CAC scoring is reasonable to use in the decision to withhold, postpone, or initiate statin therapy in intermediate-risk or selected borderline-risk asymptomatic adults (age 22-75 years and LDL-C >=70 to <190 mg/dL) who do not have diabetes or established atherosclerotic cardiovascular disease (ASCVD).* In intermediate-risk (10-year ASCVD risk >=7.5% to <20%) adults or selected borderline-risk (10-year ASCVD risk >=5% to <7.5%) adults in whom a CAC score is measured for the purpose of making a treatment decision the following recommendations have been made:  If CAC=0, it is reasonable to withhold statin therapy and reassess in 5 to 10 years, as long as higher risk conditions are absent (diabetes mellitus, family history of premature CHD in first degree relatives (males <55 years; females <65 years), cigarette smoking, or LDL >=190 mg/dL).  If CAC is 1 to 99, it is reasonable to initiate statin  therapy for patients >=63 years of age.  If CAC is >=100 or >=75th percentile, it is reasonable to initiate statin therapy at any age.  Cardiology referral should be considered for patients with CAC scores >=400 or >=75th percentile.  *2018 AHA/ACC/AACVPR/AAPA/ABC/ACPM/ADA/AGS/APhA/ASPC/NLA/PCNA Guideline on the Management of Blood Cholesterol: A Report of the American College of Cardiology/American Heart Association Task Force on Clinical Practice Guidelines. J Am Coll Cardiol. 2019;73(24):3168-3209.  Wilbert Bihari, MD  Electronically Signed: By: Wilbert Bihari M.D. On: 08/15/2023 10:20     ______________________________________________________________________________________________        Risk Assessment/Calculations:     HYPERTENSION CONTROL Vitals:   09/10/24 1002 09/10/24 1032  BP: (!) 160/90 (!) 142/84    The patient's blood pressure is elevated above target today.  In order to address the patient's elevated BP: Blood pressure will be monitored at home to determine if medication changes need to be made.; Follow up with primary care provider for management.          Physical Exam:   VS:  BP (!) 142/84   Pulse 83   Resp 17   Ht 5' 10 (1.778 m)   Wt 256 lb (116.1 kg)   SpO2 98%   BMI 36.73 kg/m    Wt Readings from Last 3 Encounters:  09/10/24 256 lb (116.1 kg)  07/04/23 262 lb 9.6 oz (119.1 kg)  06/23/22 247 lb 3.2 oz (112.1 kg)    GEN: Well nourished, well developed in no acute distress NECK: No JVD; No carotid bruits CARDIAC: RRR, no murmurs, rubs, gallops RESPIRATORY:  Clear to auscultation without rales, wheezing or rhonchi  ABDOMEN: Soft, non-tender, non-distended EXTREMITIES:  No edema; No deformity   ASSESSMENT AND PLAN: .    HTN - BP elevated today after stressful phone call prior to OV. Initial BP in clinic 160/90 which improved to 142/84 without intervention. Recent BP by outside provider 132/82, 130/88. Home BP 130s/80s. Has recently made  lifestyle changes to increase exercise. Continue hydrochlorothiazide  25mg  daily. Refill provided. He sees PCP in November, if BP at home routinely >130/80, consider addition of ARB which could be combined with his hydrochlorothiazide .   PAC - occasional PAC on EKG today. Asymptomatic with no significant palpitations. Defer addition of beta blocker due to history of bradycardia.   Hyperlipidemia - 04/2024 LDL 67. Continue Rosuvastatin 10mg  daily. Tolerating without myalgias.  CAD - Stable with no anginal symptoms. No indication for ischemic evaluation.  GDMT Rosuvastatin 10mg  daily, aspirin 81mg  every other day. No BB due to previous bradycardia. Recommend aiming for 150 minutes of moderate intensity activity per week and following a heart healthy diet.     OSA - CPAP compliance encouraged. Endorses using regularly.    GERD - Continue to follow with PCP.        Dispo: follow up in 1 year  Signed, Reche GORMAN Finder, NP

## 2024-11-25 ENCOUNTER — Other Ambulatory Visit: Payer: Self-pay | Admitting: Dermatology

## 2024-11-25 DIAGNOSIS — L304 Erythema intertrigo: Secondary | ICD-10-CM

## 2025-01-09 ENCOUNTER — Other Ambulatory Visit: Payer: Self-pay

## 2025-01-09 DIAGNOSIS — I10 Essential (primary) hypertension: Secondary | ICD-10-CM

## 2025-01-14 ENCOUNTER — Other Ambulatory Visit: Payer: Self-pay | Admitting: Cardiovascular Disease

## 2025-01-14 DIAGNOSIS — I10 Essential (primary) hypertension: Secondary | ICD-10-CM

## 2025-01-16 MED ORDER — HYDROCHLOROTHIAZIDE 25 MG PO TABS: 25.0000 mg | ORAL_TABLET | Freq: Every day | ORAL | 2 refills | Status: AC

## 2025-01-16 NOTE — Telephone Encounter (Signed)
 Labs on 11/04/24

## 2025-04-16 ENCOUNTER — Ambulatory Visit: Admitting: Dermatology
